# Patient Record
Sex: Female | Born: 1950 | Race: White | Hispanic: No | Marital: Married | Smoking: Never smoker
Health system: Southern US, Community
[De-identification: ages and names within clinical notes are randomized; demographics above are authoritative.]

## PROBLEM LIST (undated history)

## (undated) DIAGNOSIS — A6000 Herpesviral infection of urogenital system, unspecified: Secondary | ICD-10-CM

## (undated) DIAGNOSIS — Z1501 Genetic susceptibility to malignant neoplasm of breast: Secondary | ICD-10-CM

## (undated) DIAGNOSIS — Z1502 Genetic susceptibility to malignant neoplasm of ovary: Secondary | ICD-10-CM

## (undated) DIAGNOSIS — K5790 Diverticulosis of intestine, part unspecified, without perforation or abscess without bleeding: Secondary | ICD-10-CM

## (undated) DIAGNOSIS — Z1509 Genetic susceptibility to other malignant neoplasm: Secondary | ICD-10-CM

## (undated) DIAGNOSIS — C569 Malignant neoplasm of unspecified ovary: Secondary | ICD-10-CM

## (undated) DIAGNOSIS — H9319 Tinnitus, unspecified ear: Secondary | ICD-10-CM

## (undated) DIAGNOSIS — C439 Malignant melanoma of skin, unspecified: Secondary | ICD-10-CM

## (undated) DIAGNOSIS — E041 Nontoxic single thyroid nodule: Secondary | ICD-10-CM

## (undated) DIAGNOSIS — M1990 Unspecified osteoarthritis, unspecified site: Secondary | ICD-10-CM

## (undated) HISTORY — DX: Genetic susceptibility to malignant neoplasm of ovary: Z15.02

## (undated) HISTORY — DX: Malignant neoplasm of unspecified ovary: C56.9

## (undated) HISTORY — DX: Diverticulosis of intestine, part unspecified, without perforation or abscess without bleeding: K57.90

## (undated) HISTORY — DX: Genetic susceptibility to other malignant neoplasm: Z15.09

## (undated) HISTORY — DX: Nontoxic single thyroid nodule: E04.1

## (undated) HISTORY — DX: Tinnitus, unspecified ear: H93.19

## (undated) HISTORY — DX: Genetic susceptibility to malignant neoplasm of breast: Z15.01

## (undated) HISTORY — DX: Malignant melanoma of skin, unspecified: C43.9

## (undated) HISTORY — DX: Unspecified osteoarthritis, unspecified site: M19.90

## (undated) HISTORY — DX: Herpesviral infection of urogenital system, unspecified: A60.00

---

## 1972-08-13 HISTORY — PX: AXILLARY LYMPH NODE DISSECTION: SHX5229

## 1984-08-13 HISTORY — PX: BUNIONECTOMY: SHX129

## 2013-08-20 HISTORY — PX: PORTA CATH INSERTION: CATH118285

## 2013-08-20 HISTORY — PX: TOTAL ABDOMINAL HYSTERECTOMY W/ BILATERAL SALPINGOOPHORECTOMY: SHX83

## 2017-05-13 HISTORY — PX: PORTA CATH INSERTION: CATH118285

## 2017-10-10 ENCOUNTER — Other Ambulatory Visit: Payer: Self-pay | Admitting: Gynecologic Oncology

## 2017-10-10 DIAGNOSIS — Z1239 Encounter for other screening for malignant neoplasm of breast: Secondary | ICD-10-CM

## 2017-10-10 DIAGNOSIS — R978 Other abnormal tumor markers: Secondary | ICD-10-CM

## 2017-10-10 DIAGNOSIS — Z1501 Genetic susceptibility to malignant neoplasm of breast: Secondary | ICD-10-CM

## 2017-10-10 DIAGNOSIS — Z95828 Presence of other vascular implants and grafts: Secondary | ICD-10-CM

## 2017-10-10 DIAGNOSIS — Z1509 Genetic susceptibility to other malignant neoplasm: Secondary | ICD-10-CM

## 2017-10-10 DIAGNOSIS — Z1502 Genetic susceptibility to malignant neoplasm of ovary: Secondary | ICD-10-CM

## 2017-10-10 DIAGNOSIS — Z79899 Other long term (current) drug therapy: Secondary | ICD-10-CM

## 2017-10-10 MED ORDER — HEPARIN SOD (PORK) LOCK FLUSH 100 UNIT/ML IV SOLN
500.0000 [IU] | Freq: Once | INTRAVENOUS | Status: DC
  Filled 2017-10-10: qty 5

## 2017-10-10 MED ORDER — SODIUM CHLORIDE 0.9% FLUSH
10.0000 mL | INTRAVENOUS | Status: DC | PRN
  Filled 2017-10-10: qty 10

## 2017-10-22 ENCOUNTER — Encounter: Payer: Self-pay | Admitting: Gynecologic Oncology

## 2017-10-23 ENCOUNTER — Inpatient Hospital Stay: Payer: Medicare Other | Attending: Gynecologic Oncology | Admitting: Obstetrics

## 2017-10-23 ENCOUNTER — Inpatient Hospital Stay: Payer: Medicare Other

## 2017-10-23 ENCOUNTER — Encounter: Payer: Self-pay | Admitting: Obstetrics

## 2017-10-23 ENCOUNTER — Encounter: Payer: Self-pay | Admitting: Gynecologic Oncology

## 2017-10-23 VITALS — BP 136/78 | HR 88 | Temp 97.9°F | Resp 20 | Ht 64.0 in | Wt 117.3 lb

## 2017-10-23 DIAGNOSIS — Z79899 Other long term (current) drug therapy: Secondary | ICD-10-CM

## 2017-10-23 DIAGNOSIS — Z800 Family history of malignant neoplasm of digestive organs: Secondary | ICD-10-CM

## 2017-10-23 DIAGNOSIS — D649 Anemia, unspecified: Secondary | ICD-10-CM

## 2017-10-23 DIAGNOSIS — C569 Malignant neoplasm of unspecified ovary: Secondary | ICD-10-CM | POA: Diagnosis present

## 2017-10-23 DIAGNOSIS — Z90710 Acquired absence of both cervix and uterus: Secondary | ICD-10-CM

## 2017-10-23 DIAGNOSIS — R978 Other abnormal tumor markers: Secondary | ICD-10-CM

## 2017-10-23 DIAGNOSIS — D6489 Other specified anemias: Secondary | ICD-10-CM

## 2017-10-23 DIAGNOSIS — Z1509 Genetic susceptibility to other malignant neoplasm: Secondary | ICD-10-CM

## 2017-10-23 DIAGNOSIS — M542 Cervicalgia: Secondary | ICD-10-CM

## 2017-10-23 DIAGNOSIS — Z95828 Presence of other vascular implants and grafts: Secondary | ICD-10-CM

## 2017-10-23 DIAGNOSIS — D569 Thalassemia, unspecified: Secondary | ICD-10-CM | POA: Diagnosis not present

## 2017-10-23 DIAGNOSIS — Z1501 Genetic susceptibility to malignant neoplasm of breast: Secondary | ICD-10-CM

## 2017-10-23 DIAGNOSIS — Z1502 Genetic susceptibility to malignant neoplasm of ovary: Secondary | ICD-10-CM

## 2017-10-23 DIAGNOSIS — Z9221 Personal history of antineoplastic chemotherapy: Secondary | ICD-10-CM | POA: Diagnosis not present

## 2017-10-23 DIAGNOSIS — Z90722 Acquired absence of ovaries, bilateral: Secondary | ICD-10-CM | POA: Diagnosis not present

## 2017-10-23 DIAGNOSIS — D696 Thrombocytopenia, unspecified: Secondary | ICD-10-CM | POA: Diagnosis not present

## 2017-10-23 DIAGNOSIS — C801 Malignant (primary) neoplasm, unspecified: Secondary | ICD-10-CM

## 2017-10-23 LAB — CBC WITH DIFFERENTIAL (CANCER CENTER ONLY)
BASOS ABS: 0 10*3/uL (ref 0.0–0.1)
BASOS PCT: 0 %
Eosinophils Absolute: 0 10*3/uL (ref 0.0–0.5)
Eosinophils Relative: 1 %
HEMATOCRIT: 29.8 % — AB (ref 34.8–46.6)
Hemoglobin: 9.9 g/dL — ABNORMAL LOW (ref 11.6–15.9)
Lymphocytes Relative: 25 %
Lymphs Abs: 1.3 10*3/uL (ref 0.9–3.3)
MCH: 31.3 pg (ref 25.1–34.0)
MCHC: 33.2 g/dL (ref 31.5–36.0)
MCV: 94.2 fL (ref 79.5–101.0)
MONO ABS: 0.5 10*3/uL (ref 0.1–0.9)
Monocytes Relative: 9 %
NEUTROS ABS: 3.6 10*3/uL (ref 1.5–6.5)
NEUTROS PCT: 65 %
Platelet Count: 213 10*3/uL (ref 145–400)
RBC: 3.17 MIL/uL — AB (ref 3.70–5.45)
RDW: 15.3 % — AB (ref 11.2–14.5)
WBC: 5.5 10*3/uL (ref 3.9–10.3)

## 2017-10-23 LAB — HEPATIC FUNCTION PANEL
ALT: 24 U/L (ref 14–54)
AST: 36 U/L (ref 15–41)
Albumin: 4.3 g/dL (ref 3.5–5.0)
Alkaline Phosphatase: 90 U/L (ref 38–126)
BILIRUBIN INDIRECT: 0.7 mg/dL (ref 0.3–0.9)
Bilirubin, Direct: 0.1 mg/dL (ref 0.1–0.5)
TOTAL PROTEIN: 6.9 g/dL (ref 6.5–8.1)
Total Bilirubin: 0.8 mg/dL (ref 0.3–1.2)

## 2017-10-23 LAB — BASIC METABOLIC PANEL
ANION GAP: 9 (ref 3–11)
BUN: 17 mg/dL (ref 7–26)
CO2: 25 mmol/L (ref 22–29)
Calcium: 9.2 mg/dL (ref 8.4–10.4)
Chloride: 106 mmol/L (ref 98–109)
Creatinine, Ser: 0.88 mg/dL (ref 0.60–1.10)
Glucose, Bld: 86 mg/dL (ref 70–140)
POTASSIUM: 3.6 mmol/L (ref 3.5–5.1)
SODIUM: 140 mmol/L (ref 136–145)

## 2017-10-23 MED ORDER — NIRAPARIB TOSYLATE 100 MG PO CAPS: 200.0000 mg | ORAL_CAPSULE | Freq: Every day | ORAL | 4 refills | Status: DC

## 2017-10-23 MED ORDER — SODIUM CHLORIDE 0.9% FLUSH
10.0000 mL | Freq: Once | INTRAVENOUS | Status: AC
  Administered 2017-10-23: 10 mL
  Filled 2017-10-23: qty 10

## 2017-10-23 MED ORDER — SODIUM CHLORIDE 0.9% FLUSH
10.0000 mL | INTRAVENOUS | Status: DC | PRN
  Filled 2017-10-23: qty 10

## 2017-10-23 MED ORDER — HEPARIN SOD (PORK) LOCK FLUSH 100 UNIT/ML IV SOLN
500.0000 [IU] | Freq: Once | INTRAVENOUS | Status: AC
  Administered 2017-10-23: 500 [IU]
  Filled 2017-10-23: qty 5

## 2017-10-23 NOTE — Progress Notes (Signed)
Consult Note: Gyn-Onc  CC:  Chief Complaint  Patient presents with  . Malignant neoplasm of ovary, unspecified laterality [facility])    HPI: Madeline Perry  is a very nice 67 y.o.  year old P0  Interval History: Recall she was treated for ovarian cancer originally by Dr. Gifford Shave at my [facility]ma [facility] in [place].  She returns today to follow-up on PARP maintenance.  Recall she did have a bit of a delay in her initiation due to insurance issues.  Issues:  . Neuropathy was notable with her second line platinum Taxol therapy.  No complaints by the end of chemotherapy treatment . Thyroid nodule-PCP managing.  Biopsy has been done and reportedly benign . Vaginal spotting April 11, 2017-described as pink and watery staining underwear to times not enough to leak through.  Denies increased activity surrounding this episode.  Felt to be atrophy based on my exam at that time. . Thrombocytopenia-third week after neuropathy of started at standard dosing of 300 she did experience thrombocytopenia with platelets down to 20 3K.  We held her treatment May 22, 2017.  Repeat platelet May 28, 2017 was 9K and she was transfused.  She was then dose reduced down to 200 and experienced anemia which required transfusion in late 2018 . Anemia-cycle 3 of niraparib observed to 8.9.  Iron studies were ordered and normal.    08/27/2017 she did have anemia significant enough to require transfusion she was held and restarted and presents today in follow-up at my new practice site.    Measurement of disease: CA 125 and imaging   Oncology History   She presented in December 2014 to her PCP with pelvic pain and imaging was performed.  A pelvic mass was found and she made her way to Dr. Isaiah Blakes.  She then underwent IV/IP Taxol/cis/Taxol for 6 cycles.  Her Ca125 preop was >4000.  It looks to have fallen nicely during treatment and was 11.9 in November 2016 1 year after her treatment ended.  She was  found to have a BRCA2 mutation, documented now in the chart, from myriad my risk testing March 2015.  Recurrence February 2018.  Received 6 cycles of IV carboplatin Taxol. Issues with second line chemotherapy included neuropathy and petechiae, now resolved, in lower extremities, but no thrombocytopenia.  PARP maintenance 05/02/2017. Experienced thrombocytopenia and anemia.      Malignant neoplasm of ovary ([place])   10/2013 Genetic Testing    Patient has genetic testing done for her ovarian cancer diagnosis. 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 07/31/2013-UTS W-peritoneal carcinomatosis omental caking large mass in the pelvis liver and spleen do not have any focal or diffuse parenchymal abnormalities.  Calcification in the right lung base probable granuloma.  Ascites. . 11/08/2016-RT a-a heterogeneous nodule from the posterior right thyroid measuring 1.7 x 1.3.  Multiple metastatic lesions in the liver including a segment 3 lesion 3.3 x 2.9 cm and a segment 7 lesion measuring 3.2 x 3 cm.  In the pelvis there is a soft tissue mass more superiorly measuring 2.4 x 1.6 cm and then inferior to this measuring 2.8 x 2.3 cm.  Addendum compared to prior there are no significant differences in the overall interpretation.  Note that the thyroid lesion was not present on the  prior. . 12/21/2016-thyroid ultrasound right-sided thyroid nodules recommend 3-54-monthfollow-up. . 02/11/2017-CT CAP-[facility]-hepatic metastases have decreased in size or resolved compared to March 2018 index lesions remaining today include 14 mm right hepatic lobe; 11 x 11 mm right hepatic lobe abutting the caudate lobe; 7 mm anterior right hepatic lobe; 16 mm lateral segment left hepatic lobe.  Left pelvic mass no longer seen. .  02/11/2017-PET-[facility]-liver demonstrates areas of diminished FDG avidity consistent with cystlike changes no distinct FDG avid abnormalities noted; longitudinally oriented increased activity along the lateral aspect of the sigmoid colon at the mid pelvis SUV 3.11. . 08/26/2017-RTA-CT [facility]-large calcified granuloma right middle lobe.  Focal area of masslike consolidation right middle lobe which is new from prior study 2.3 cm pneumonia versus malignancy.  Degenerative changes in the spine with convex scoliosis at the thoracic lumbar junction.  Diffuse fatty infiltration of the liver has decreased.  Low-attenuation mass posterior superior segment right lobe of the liver measures 0.9 cm versus 1.8 cm February 11, 2017.  Subtle low-attenuation lesion in the posterior superior segment of the right lobe of the liver measuring 0.6 cm on current exam versus 1.1 cm 02/11/2017.  Low-attenuation lesion in the left lobe of the liver which measures 0.7 cm versus 1.6 cm July 2018.  No new or progressive liver lesions seen.  Diverticulosis.  Mild circumferential wall thickening involving the sigmoid colon left pelvis which does correlate with an area of uptake on a PET July 2018.  No peritoneal based soft tissue mass demonstrated.  No adnexal mass.        Tumor Marker    Ca125 08/18/2013 preop > 4000 06/2015 = 11.9 03/2016 = 16___________________ Recurrence (2.5 years disease free) 09/2016 = 45 recheck 63 11/08/2016 = 292 12/21/2016 = 91 (cycle 2) 01/09/2017 = 17.1 (cycle 3) 03/14/2017 = 14 (cycle 6)   end of treatment_____ Niraparib start 05/02/2017 05/08/2017 = 11.2 (C1) 05/28/2017 = 9 (C2) 06/27/2017 = 13 (C3) 07/2017 = ______(C4) 08/14/2017 = 10 (C5) CHANGE Facility of care 10/2017 = ______________ 07/22/2017 = 08/14/17 = 10 No results for input(s): CA125, CEA, CA199, ESTRADIOL in the last 8760 hours.  Invalid input(s): INHIBINA  No results found for: LABCA2   No results found for: CVZS827         Current Meds:   Outpatient Encounter Medications as of 10/23/2017  Medication Sig  . Ascorbic Acid (VITAMIN C PO) Take by mouth.  .Marland KitchenGLUTAMINE PO Take by mouth daily.   .Alanda SlimTosylate 100 MG CAPS Take 200 mg by mouth daily. With or without food at the same time daily. May take at bedtime to manage nausea  . Pyridoxine HCl (VITAMIN B-6 PO) Take by mouth.  . vitamin B-12 (CYANOCOBALAMIN) 1000 MCG tablet Take 1,000 mcg by mouth daily.  . [DISCONTINUED] Niraparib Tosylate 100 MG CAPS Take 200 mg by mouth daily. With or without food at the same time daily. May take at bedtime to manage nausea  . [DISCONTINUED] ondansetron (ZOFRAN) 4 MG tablet Take 4 mg by mouth every 12 (twelve) hours.  . [DISCONTINUED] promethazine (PHENERGAN) 25 MG tablet Take 25 mg by mouth every 6 (six) hours as needed.   Facility-Administered Encounter Medications as of 10/23/2017  Medication  . sodium chloride flush (NS) 0.9 % injection 10 mL    Allergy: No Known Allergies  Social Hx:   Social History   Socioeconomic History  . Marital status: Married    Spouse name: Not on file  . Number  of children: Not on file  . Years of education: Not on file  . Highest education level: Not on file  Social Needs  . Financial resource strain: Not on file  . Food insecurity - worry: Not on file  . Food insecurity - inability: Not on file  . Transportation needs - medical: Not on file  . Transportation needs - non-medical: Not on file  Occupational History  . Not on file  Tobacco Use  . Smoking status: Never Smoker  . Smokeless tobacco: Never Used  Substance and Sexual Activity  . Alcohol use: Yes    Comment: occasional  . Drug use: No  . Sexual activity: Yes  Other Topics Concern  . Not on file  Social History Narrative  . Not on file    Past Surgical Hx:  Past Surgical History:  Procedure Laterality Date  . AXILLARY LYMPH NODE DISSECTION  1974   right for melanoma followed by chemo  . BUNIONECTOMY Left 08/1984  .  PORTA CATH INSERTION  08/20/2013   INTRAPERITONEAL at the time of TAH, BSO; removed 2015  . Glori Luis CATH INSERTION  05/2017   [facility]  . TOTAL ABDOMINAL HYSTERECTOMY W/ BILATERAL SALPINGOOPHORECTOMY  08/20/2013   TAH, BSO, tumor debulking, bowel resection with reanastomosis, splenectomy, omentectomy, argon beam coag, diaphragm stripping    Past Medical Hx:  Past Medical History:  Diagnosis Date  . BRCA2 gene mutation positive in female   . Diverticulosis   . Genital herpes   . History of chemotherapy    6 cycles of carb/taxol  . Melanoma ([place])   . Osteoarthritis   . Ovarian cancer ([place])   . Thyroid nodule   . Tinnitus     Past Gynecological History:   GYNECOLOGIC HISTORY:  No LMP recorded. Menarche: 67 years old P 0   Family Hx:  Family History  Problem Relation Age of Onset  . Ovarian cancer Mother   . Breast cancer Maternal Grandmother   . Uterine cancer Maternal Aunt     Review of Systems:  Review of Systems  Constitutional: Negative.   HENT: Negative.   Eyes: Negative.   Respiratory: Positive for shortness of breath.   Cardiovascular: Negative.   Gastrointestinal: Negative.   Genitourinary: Negative.   Musculoskeletal: Positive for neck pain.  Skin: Negative.   Neurological: Negative.   Endo/Heme/Allergies: Negative.   Psychiatric/Behavioral: Negative.    SOB with exertion Neck pain is described when she extends her neck backwards there is a pain radiating down her left scapular/shoulder area Complains of some left thumb swelling in the morning    Vitals:  Blood pressure 136/78, pulse 88, temperature 97.9 F (36.6 C), temperature source Oral, resp. rate 20, height '5\' 4"'  (1.626 m), weight 117 lb 4.8 oz (53.2 kg), SpO2 100 %.  Physical Exam: ECOG PERFORMANCE STATUS: 0 - Asymptomatic   General :  Well developed, 67 y.o., female in no apparent distress HEENT:  Normocephalic/atraumatic, symmetric, EOMI, eyelids normal Neck:    Supple, no masses.  Lymphatics:  No cervical/ submandibular/ supraclavicular/ infraclavicular/ inguinal adenopathy Respiratory:  Respirations unlabored, no use of accessory muscles CV:   Deferred Breast:  Deferred Musculoskeletal: No CVA tenderness, normal muscle strength. Abdomen: Soft, non-tender and nondistended. No evidence of hernia. No masses. Extremities:  No lymphedema, no erythema, non-tender. Skin:   Normal inspection Neuro/Psych:  No focal motor deficit, no abnormal mental status. Normal gait. Normal affect. Alert and oriented to person, place, and time  Genito Urinary:  Vulva:  Normal external female genitalia.   Bladder/urethra:  Urethral meatus normal in size and location. No lesions or masses, well supported bladder  Vagina: No lesion, no discharge, no bleeding.  Cervix/Uterus/Adnexa: Surgically absent  Bimanual: No masses no lesions  Rectovaginal:  Good tone, no masses, no cul de sac nodularity, no parametrial involvement or nodularity.   Oncologic Summary: 1. Stage 3C (vs 4) Ovarian Cancer  - s/p Debulking Jan 2015 at [facility] (Dr. Gifford Shave) ELAP/TAH/BSO/Splenectomy/ TColon rsxn with RA 08/20/13   - splenic pathology with "superficial parenchymal disease"  - s/p 6 cycles IV/IP Taxol/Cis/Taxol 01/06/18 2. Recurrent disease Feb 2018 by rising CA125 and imaging  - Carbo/Taxol Q3 week 11/30/17-03/14/17 (partial response)  - Niraparib 05/02/17 - present   Start 338m with thrombocytopenia requiring dose reduction   Decrease to 2067m10/2018 with anemia 3. 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 BRCA2 predispositions  . Breast screening   - [place] [place] [facility] for breast MRI    - NOTE she has vacation plans in mid-Sept so we'll have to work around that with scheduling. Probably end of Sept.   - [place]  . Pancreatic risks   - we discussed referral to GI at some point to discuss options. For now we agreed on annual CA19-9 which is due today and each March going forward now. 2. Port was placed by IR.  . I gave [facility] today [facility] here, so in 6 weeks and another on her return here in 3 months 3. Recurrent ovarian cancer  . tumor profiling has been discussed on previous visits; we'll see if she progresses on PARP before revisiting  . CA 125 is her MOD and will be checked monthly  . PARP maintenance continues, which was started with measurable disease on imaging   - [facility] and she appeared to be NED.     - there was some discussion of ? PNA versus malignant process in the lungs. Followup CXR was done from my recollection.     - The office here is going to request that to be sure. She does have some SOB with exertion that seems stable from prior visits. 4. Thrombocytopenia  . likely due to PARP  . Had count down to 9000 and transfused 05/2017 with hold and dose reduction on PARP 5. Anemia  . Had transfusion 2UPRBC 08/27/17 after being on 20078mose of niraparib 3 months.  . Noted to show slow drift since that time. 2/15 = 11; 2/20 = 11.3; 3/1 = 10.4.  . rMarland Kitchenpeat today and continue Q2 weekly check 6. PARP management  . monthly BMP, LFT's, CA125, CBC (however see note above re: anemia)  . refill planned today; Melissa will contact [facility] ([phone] see how to proceed  7. Neck pain radiating down left shoulder  . Check Cspine plain films - 4 view 8.  She asked me today about donation/research options once she passes. [facility] and [facility] know.  RTC Q 3 months for pelvic and monitoring of labs.    ShaIsabel CapriceD  10/23/2017, 4:58 PM   '@APPTREFPROVNAME' @ '@[email or web address]' @

## 2017-10-23 NOTE — Patient Instructions (Signed)
Plan . Labs have been ordered for you to have a CBC every 2 weeks to check for anemia . Monthly we will check your electrolytes and liver function along with your Ca1 25 . A C-spine x-ray will be ordered due to your neck pain . We will contact you about your tumor markers drawn today along with your mammogram results. . Your PARP inhibitor will be refilled . [place] to clinic in [place] in 3 months for repeat lab work/and pelvic

## 2017-10-23 NOTE — Progress Notes (Signed)
Script for Niraparib faxed to [facility] at [phone].

## 2017-10-24 ENCOUNTER — Telehealth: Payer: Self-pay | Admitting: Gynecologic Oncology

## 2017-10-24 LAB — CA 125: Cancer Antigen (CA) 125: 12.4 U/mL (ref 0.0–38.1)

## 2017-10-24 LAB — CANCER ANTIGEN 19-9: CA 19-9: 26 U/mL (ref 0–35)

## 2017-10-24 NOTE — Telephone Encounter (Signed)
Left message for patient with CA 125 and CA 19-9 results.  Advised her to please call for any questions or concerns.

## 2017-10-24 NOTE — Telephone Encounter (Signed)
Patient returned call to the office.  Informed of CA 125 and CA 19-9 results.  No concerns voiced.  Advised to call for any needs.

## 2017-10-31 ENCOUNTER — Telehealth: Payer: Self-pay | Admitting: *Deleted

## 2017-10-31 ENCOUNTER — Telehealth: Payer: Self-pay

## 2017-10-31 NOTE — Telephone Encounter (Signed)
Pt. Calling from the dental office.  She is there for a cleaning and repair of crown that popped off yesterday. Pt on Niraparib Tosylate 200 mg daily for her ovarian cancer. Told Becky the dental technician that her cbc/diff results from 10-23-17 were good to proceed with cleaning. WBC =5.5 ANC=3.6  Plts= 213. Faxed a copy of cbc/ diff to dental office at [phone]

## 2017-10-31 NOTE — Telephone Encounter (Signed)
Faxed orders to radiology, lab, and OR to [facility]

## 2017-11-04 ENCOUNTER — Encounter: Payer: Self-pay | Admitting: Obstetrics

## 2017-11-07 ENCOUNTER — Telehealth: Payer: Self-pay | Admitting: Gynecologic Oncology

## 2017-11-07 NOTE — Telephone Encounter (Signed)
Left message for patient with C spine xray results along with Dr. Gerarda Fraction recommendations for referral to spine or neurosurgeon for further evaluation.  Advised patient that we could place the referral or fax the results to her PCP to have the referral initiated through their office.  Advised her to please call the office to discuss.

## 2017-11-07 NOTE — Telephone Encounter (Signed)
Patient returned call to the office.  Stating she would prefer to have the results faxed to her primary care provider for her to evaluate and make the referral.  Patient asking about her lab work as well but advised we had not received it yet but would call to inquire about the results.  Advised she would be contacted with the results.  No concerns voiced.  Advised to call for any needs or concerns.

## 2017-11-11 ENCOUNTER — Encounter: Payer: Self-pay | Admitting: Gynecologic Oncology

## 2017-11-11 ENCOUNTER — Telehealth: Payer: Self-pay

## 2017-11-11 NOTE — Progress Notes (Signed)
C spine xray and lab results faxed to patient's Dr. Harlon Ditty at [phone]

## 2017-11-11 NOTE — Telephone Encounter (Signed)
Told Ms Waldren that her cbc/diff from 11-08-17 looked good.  She can continue with her niraparib tosylate 200 mg daily and repeat cbc/diff in 2 weeks.

## 2017-11-20 ENCOUNTER — Telehealth: Payer: Self-pay | Admitting: Gynecologic Oncology

## 2017-11-20 NOTE — Telephone Encounter (Signed)
Patient notified of lab results including hgb of 7.1.  Per Dr. Gerarda Fraction, patient advised to hold on taking the PARP at this time and to plan for a repeat CBC in one week.  Per the patient, she is out of town in [place] from the 14-19th of April and asking if she can have it checked in two weeks when she goes for her port flush.  Per Dr. Gerarda Fraction, patient can have checked in two weeks but needs to stay vigilant for anemic symptoms.  Returned call to patient and left message about having it rechecked in two weeks and to remain aware of her symptoms.  She currently states she is asymptomatic except for mild shortness of breath with strenuous activity that resolves immediately.  Advised her to please call the office for any questions or concerns.

## 2017-11-21 ENCOUNTER — Telehealth: Payer: Self-pay

## 2017-11-21 NOTE — Telephone Encounter (Signed)
LM for patient that her CA-125 from 11-20-17 was good at 6.7 per Joylene John, NP.  Call the office if she has any questions or concerns.

## 2017-11-28 ENCOUNTER — Telehealth: Payer: Self-pay | Admitting: *Deleted

## 2017-11-28 NOTE — Telephone Encounter (Signed)
Faxed lab orders to [facility]

## 2017-12-04 ENCOUNTER — Telehealth: Payer: Self-pay | Admitting: Gynecologic Oncology

## 2017-12-04 ENCOUNTER — Telehealth: Payer: Self-pay | Admitting: *Deleted

## 2017-12-04 DIAGNOSIS — D6489 Other specified anemias: Secondary | ICD-10-CM

## 2017-12-04 NOTE — Telephone Encounter (Signed)
Fax order to [facility] for CBC lab

## 2017-12-04 NOTE — Telephone Encounter (Signed)
Patient states she is doing well.  States she had her labs drawn yesterday.  Advised we have not received the results but will be calling the facility to obtain them and then call her back to discuss.  No concerns voiced.

## 2017-12-04 NOTE — Telephone Encounter (Signed)
Left message for patient advising her of her lab work from yesterday and advised her to continue to hold her PARP inhibitor and plan for a repeat CBC in one week.  Advised that once her hemoglobin reaches 9, we would resume the PARP inhibitor at an adjusted dose.  Advised to call the office for any questions.

## 2017-12-10 ENCOUNTER — Telehealth: Payer: Self-pay

## 2017-12-10 NOTE — Telephone Encounter (Signed)
LM for Madeline Perry stating that the order for a cbc was faxed to [facility] on 12-04-17.  She can go today any time this afternoon to have labs drawn.

## 2017-12-11 ENCOUNTER — Telehealth: Payer: Self-pay | Admitting: Gynecologic Oncology

## 2017-12-11 NOTE — Telephone Encounter (Signed)
[facility] called to see if the patient's PARP was on hold or discontinued.  Advised that the medication was on hold at this time pending lab redraw today due to anemia.

## 2017-12-12 ENCOUNTER — Telehealth: Payer: Self-pay | Admitting: *Deleted

## 2017-12-12 ENCOUNTER — Telehealth: Payer: Self-pay | Admitting: Gynecologic Oncology

## 2017-12-12 DIAGNOSIS — C569 Malignant neoplasm of unspecified ovary: Secondary | ICD-10-CM

## 2017-12-12 NOTE — Telephone Encounter (Signed)
Left message with patient to let her know that we have not received her lab results just yet, our office will call and have lab results faxed over and give her a call back.

## 2017-12-13 ENCOUNTER — Other Ambulatory Visit: Payer: Self-pay | Admitting: Gynecologic Oncology

## 2017-12-13 ENCOUNTER — Telehealth: Payer: Self-pay | Admitting: *Deleted

## 2017-12-13 DIAGNOSIS — Z79899 Other long term (current) drug therapy: Secondary | ICD-10-CM

## 2017-12-13 DIAGNOSIS — C569 Malignant neoplasm of unspecified ovary: Secondary | ICD-10-CM

## 2017-12-13 MED ORDER — NIRAPARIB TOSYLATE 100 MG PO CAPS: 100.0000 mg | ORAL_CAPSULE | ORAL | 0 refills | Status: DC

## 2017-12-13 NOTE — Telephone Encounter (Signed)
Faxed lab order to [facility]

## 2017-12-13 NOTE — Telephone Encounter (Signed)
Spoke with patient and informed her of CBC results.  Advised her of the recommendations per Dr. Gerarda Fraction to begin taking niraparib on Monday May 6, she is to begin taking 200 mg then alternate with 100 mg the next day then back to 200 mg.  She will continue with this pattern for one month then plan to follow up with Dr. Gerarda Fraction in the office.  It is also recommended that she have a CBC weekly for the next month along with a Cmet and CA 125 monthly.  Appt made for June 3.  She states she will be due for a flush that day so she was going to plan on having it here in [place] along with her lab work.  Advised to call for any needs.  New prescription will be sent to [facility].  No concerns voiced at the end of the call.

## 2017-12-25 ENCOUNTER — Telehealth: Payer: Self-pay

## 2017-12-25 NOTE — Telephone Encounter (Signed)
Told Madeline Perry that her Hgb was up to 11.6.  ANC was good at 6.6. Platelets  were good at 311K. Dr. Gerarda Fraction stated to continue the niraparib at current dosing of 200 mg alternating  with 100 mg. Recheck CBC/diff weely as scheduled.  Pt verbalized understanding.

## 2017-12-30 ENCOUNTER — Telehealth: Payer: Self-pay

## 2017-12-30 NOTE — Telephone Encounter (Signed)
LM stating that her blood counts looked good.  Her Hgb is 12.0 today.  She is to continue with 200 mg alt with 100 mg. Recheck CBC/diff in one week as scheduled. Call office if any questions or concerns.

## 2018-01-13 ENCOUNTER — Inpatient Hospital Stay: Payer: Medicare Other

## 2018-01-13 ENCOUNTER — Encounter: Payer: Self-pay | Admitting: Obstetrics

## 2018-01-13 ENCOUNTER — Inpatient Hospital Stay: Payer: Medicare Other | Attending: Gynecologic Oncology | Admitting: Obstetrics

## 2018-01-13 ENCOUNTER — Encounter: Payer: Self-pay | Admitting: Oncology

## 2018-01-13 VITALS — BP 143/70 | HR 78 | Temp 98.2°F | Resp 20 | Ht 64.0 in | Wt 115.9 lb

## 2018-01-13 DIAGNOSIS — C5700 Malignant neoplasm of unspecified fallopian tube: Secondary | ICD-10-CM

## 2018-01-13 DIAGNOSIS — Z1501 Genetic susceptibility to malignant neoplasm of breast: Secondary | ICD-10-CM

## 2018-01-13 DIAGNOSIS — C569 Malignant neoplasm of unspecified ovary: Secondary | ICD-10-CM | POA: Insufficient documentation

## 2018-01-13 DIAGNOSIS — Z9221 Personal history of antineoplastic chemotherapy: Secondary | ICD-10-CM | POA: Insufficient documentation

## 2018-01-13 DIAGNOSIS — Z803 Family history of malignant neoplasm of breast: Secondary | ICD-10-CM | POA: Insufficient documentation

## 2018-01-13 DIAGNOSIS — Z90710 Acquired absence of both cervix and uterus: Secondary | ICD-10-CM

## 2018-01-13 DIAGNOSIS — Z79899 Other long term (current) drug therapy: Secondary | ICD-10-CM

## 2018-01-13 DIAGNOSIS — A6000 Herpesviral infection of urogenital system, unspecified: Secondary | ICD-10-CM | POA: Insufficient documentation

## 2018-01-13 DIAGNOSIS — Z95828 Presence of other vascular implants and grafts: Secondary | ICD-10-CM

## 2018-01-13 DIAGNOSIS — Z1509 Genetic susceptibility to other malignant neoplasm: Secondary | ICD-10-CM | POA: Diagnosis not present

## 2018-01-13 DIAGNOSIS — Z90722 Acquired absence of ovaries, bilateral: Secondary | ICD-10-CM | POA: Diagnosis not present

## 2018-01-13 DIAGNOSIS — C801 Malignant (primary) neoplasm, unspecified: Principal | ICD-10-CM

## 2018-01-13 LAB — COMPREHENSIVE METABOLIC PANEL
ALBUMIN: 4.5 g/dL (ref 3.5–5.0)
ALK PHOS: 82 U/L (ref 40–150)
ALT: 22 U/L (ref 0–55)
AST: 34 U/L (ref 5–34)
Anion gap: 9 (ref 3–11)
BILIRUBIN TOTAL: 0.4 mg/dL (ref 0.2–1.2)
BUN: 17 mg/dL (ref 7–26)
CALCIUM: 9 mg/dL (ref 8.4–10.4)
CO2: 26 mmol/L (ref 22–29)
Chloride: 105 mmol/L (ref 98–109)
Creatinine, Ser: 0.94 mg/dL (ref 0.60–1.10)
GFR calc Af Amer: >60 mL/min (ref >60)
GFR calc non Af Amer: >60 mL/min (ref >60)
GLUCOSE: 92 mg/dL (ref 70–140)
Potassium: 3.7 mmol/L (ref 3.5–5.1)
Sodium: 140 mmol/L (ref 136–145)
TOTAL PROTEIN: 7.6 g/dL (ref 6.4–8.3)

## 2018-01-13 LAB — BILIRUBIN, FRACTIONATED(TOT/DIR/INDIR)
Bilirubin, Direct: <0.1 mg/dL — ABNORMAL LOW (ref 0.1–0.5)
Total Bilirubin: 0.7 mg/dL (ref 0.3–1.2)

## 2018-01-13 LAB — CBC WITH DIFFERENTIAL (CANCER CENTER ONLY)
Basophils Absolute: 0 10*3/uL (ref 0.0–0.1)
Basophils Relative: 1 %
EOS ABS: 0.1 10*3/uL (ref 0.0–0.5)
Eosinophils Relative: 2 %
HCT: 34.4 % — ABNORMAL LOW (ref 34.8–46.6)
HEMOGLOBIN: 11.3 g/dL — AB (ref 11.6–15.9)
LYMPHS ABS: 1.5 10*3/uL (ref 0.9–3.3)
Lymphocytes Relative: 29 %
MCH: 31.1 pg (ref 25.1–34.0)
MCHC: 32.8 g/dL (ref 31.5–36.0)
MCV: 94.8 fL (ref 79.5–101.0)
MONO ABS: 0.5 10*3/uL (ref 0.1–0.9)
MONOS PCT: 11 %
NEUTROS PCT: 57 %
Neutro Abs: 3 10*3/uL (ref 1.5–6.5)
Platelet Count: 224 10*3/uL (ref 145–400)
RBC: 3.63 MIL/uL — ABNORMAL LOW (ref 3.70–5.45)
RDW: 14.8 % — AB (ref 11.2–14.5)
WBC Count: 5.1 10*3/uL (ref 3.9–10.3)

## 2018-01-13 LAB — CBC
HCT: 34.5 % — ABNORMAL LOW (ref 34.8–46.6)
Hemoglobin: 11.4 g/dL — ABNORMAL LOW (ref 11.6–15.9)
MCH: 31.4 pg (ref 25.1–34.0)
MCHC: 33 g/dL (ref 31.5–36.0)
MCV: 95.4 fL (ref 79.5–101.0)
Platelets: 221 10*3/uL (ref 145–400)
RBC: 3.62 MIL/uL — ABNORMAL LOW (ref 3.70–5.45)
RDW: 15.3 % — AB (ref 11.2–14.5)
WBC: 4.9 10*3/uL (ref 3.9–10.3)

## 2018-01-13 MED ORDER — SODIUM CHLORIDE 0.9% FLUSH
10.0000 mL | Freq: Once | INTRAVENOUS | Status: AC
  Administered 2018-01-13: 10 mL
  Filled 2018-01-13: qty 10

## 2018-01-13 MED ORDER — HEPARIN SOD (PORK) LOCK FLUSH 100 UNIT/ML IV SOLN
500.0000 [IU] | Freq: Once | INTRAVENOUS | Status: AC
  Administered 2018-01-13: 500 [IU]
  Filled 2018-01-13: qty 5

## 2018-01-13 NOTE — Progress Notes (Signed)
Consult Note: Gyn-Onc  CC:  Chief Complaint  Patient presents with  . Malignant neoplasm of ovary, unspecified laterality [facility])    HPI: [name]  is a very nice 67 y.o.  year old P0  Interval History: Recall she was treated for ovarian cancer originally by Dr. Gifford Shave at [facility] [employer] in [place].  She returns today to follow-up on PARP maintenance.  Since her last visit we have her on the regimen which gives her essentially a 114m daily on average dosing. She started this 5/6 and labs since have been stable. So it has been just over a month now.  General Issues:  . Neuropathy was notable with her second line platinum Taxol therapy.  No complaints by the end of chemotherapy treatment . Thyroid nodule-PCP managing.  Biopsy has been done and reportedly benign . Vaginal spotting April 11, 2017-described as pink and watery staining underwear to times not enough to leak through.  Denies increased activity surrounding this episode.  Felt to be atrophy based on my exam at that time.  She tells me today she is having issues at home and having trouble sleeping. She wants to know if she can change the PARP to morning dosing. She asks me for sleep medication  She is worried her herpes may break out again, but does not want medication for that.   [facility] was normal here 10/23/17 at 26  Neck radiating pain - CSpine xray was normal 11/06/17; tolerating by trying to avoid movement that incites the pain  PARP Issues: . Thrombocytopenia   3rd week after standard dosing of 300 with platelets down to 20 K.  We held her treatment May 22, 2017.  Repeat platelet May 28, 2017 was 9K and she was transfused.  She was then dose reduced down to 200   anemia which required transfusion in late 2018 . Anemia  cycle 3 observed to 8.9.  Iron studies were ordered and normal.    08/27/2017 she did have anemia significant enough to require  transfusion she was held and restarted   [place] 100 QOD.  Tells me today her sister found to be BRCA and sounds like she has breast cancer metastatic to the nodes. Patient asks me about prophylactic mastectomy   Measurement of disease:  CA 125 and imaging Ca125 08/18/2013 preop > 4000 06/2015 = 11.9 03/2016 = 16___________________ Recurrence (2.5 years disease free) 09/2016 = 45 recheck 63 11/08/2016 = 292 12/21/2016 = 91 (cycle 2) 01/09/2017 = 17.1 (cycle 3) 03/14/2017 = 14 (cycle 6)   end of treatment_____ Niraparib start 05/02/2017 05/08/2017 = 11.2 (C1) 05/28/2017 = 9 (C2) 06/27/2017 = 13 (C3) 07/2017 = ______(C4) 08/14/2017 = 10 (C5) CHANGE Facility of care 10/2017 = 12.4 (C7) 11/2017 = 6.7 (C8) 12/2017 = missed due to misunderstanding with lab draws(C9) 01/2018 = drawn today (C10)   Oncology History   She presented in December 2014 to her PCP with pelvic pain and imaging was performed.  A pelvic mass was found and she made her way to Dr. CIsaiah Blakes  She then underwent IV/IP Taxol/cis/Taxol for 6 cycles.  Her Ca125 preop was >4000.  It looks to have fallen nicely during treatment and was 11.9 in November 2016 1 year after her treatment ended.  She was found to have a BRCA2 mutation, documented now in the chart, from myriad my risk testing  March 2015.  Recurrence February 2018.  Received 6 cycles of IV carboplatin Taxol. Issues with second line chemotherapy included neuropathy and petechiae, now resolved, in lower extremities, but no thrombocytopenia.  PARP maintenance 05/02/2017. Experienced thrombocytopenia and anemia.      Malignant neoplasm of ovary ([place])   10/2013 Genetic Testing    Patient has genetic testing done for her ovarian cancer diagnosis. Results revealed patient has the following mutation(s): Myriad my risk revealed [id number] (p.Met192Valfs*13):NM_[id number]; (aka delAT)       Miscellaneous    Measurement of  Disease : CA125 and imaging       Imaging    . 07/31/2013-UTS W-peritoneal carcinomatosis omental caking large mass in the pelvis liver and spleen do not have any focal or diffuse parenchymal abnormalities.  Calcification in the right lung base probable granuloma.  Ascites. . 11/08/2016-RT a-a heterogeneous nodule from the posterior right thyroid measuring 1.7 x 1.3.  Multiple metastatic lesions in the liver including a segment 3 lesion 3.3 x 2.9 cm and a segment 7 lesion measuring 3.2 x 3 cm.  In the pelvis there is a soft tissue mass more superiorly measuring 2.4 x 1.6 cm and then inferior to this measuring 2.8 x 2.3 cm.  Addendum compared to prior there are no significant differences in the overall interpretation.  Note that the thyroid lesion was not present on the prior. . 12/21/2016-thyroid ultrasound right-sided thyroid nodules recommend 3-68-monthfollow-up. . 02/11/2017-CT CAP[facility] imaging-hepatic metastases have decreased in size or resolved compared to March 2018 index lesions remaining today include 14 mm right hepatic lobe; 11 x 11 mm right hepatic lobe abutting the caudate lobe; 7 mm anterior right hepatic lobe; 16 mm lateral segment left hepatic lobe.  Left pelvic mass no longer seen. . 02/11/2017-PET-[facility]-liver demonstrates areas of diminished FDG avidity consistent with cystlike changes no distinct FDG avid abnormalities noted; longitudinally oriented increased activity along the lateral aspect of the sigmoid colon at the mid pelvis SUV 3.11. . 08/26/2017-RTA-CT [facility]-large calcified granuloma right middle lobe.  Focal area of masslike consolidation right middle lobe which is new from prior study 2.3 cm pneumonia versus malignancy.  Degenerative changes in the spine with convex scoliosis at the thoracic lumbar junction.  Diffuse fatty infiltration of the liver has decreased.  Low-attenuation mass posterior superior segment right lobe of the liver measures 0.9 cm versus 1.8 cm February 11, 2017.   Subtle low-attenuation lesion in the posterior superior segment of the right lobe of the liver measuring 0.6 cm on current exam versus 1.1 cm 02/11/2017.  Low-attenuation lesion in the left lobe of the liver which measures 0.7 cm versus 1.6 cm July 2018.  No new or progressive liver lesions seen.  Diverticulosis.  Mild circumferential wall thickening involving the sigmoid colon left pelvis which does correlate with an area of uptake on a PET July 2018.  No peritoneal based soft tissue mass demonstrated.  No adnexal mass.        Tumor Marker    Ca125 08/18/2013 preop > 4000         Current Meds:  Outpatient Encounter Medications as of 01/13/2018  Medication Sig  . Ascorbic Acid (VITAMIN C PO) Take by mouth.  .Marland KitchenGLUTAMINE PO Take by mouth daily.   .Alanda SlimTosylate 100 MG CAPS Take 100 mg by mouth as directed. Alternate between 100 mg daily then 200 mg on alternate days x 1 mth With or without food at the same time daily. May take at bedtime to manage  nausea  . Pyridoxine HCl (VITAMIN B-6 PO) Take by mouth.  . vitamin B-12 (CYANOCOBALAMIN) 1000 MCG tablet Take 1,000 mcg by mouth daily.  . [EXPIRED] heparin lock flush 100 unit/mL   . [EXPIRED] sodium chloride flush (NS) 0.9 % injection 10 mL    No facility-administered encounter medications on file as of 01/13/2018.     Allergy: No Known Allergies  Social Hx:   Social History   Socioeconomic History  . Marital status: Married    Spouse name: Not on file  . Number of children: Not on file  . Years of education: Not on file  . Highest education level: Not on file  Occupational History  . Not on file  Social Needs  . Financial resource strain: Not on file  . Food insecurity:    Worry: Not on file    Inability: Not on file  . Transportation needs:    Medical: Not on file    Non-medical: Not on file  Tobacco Use  . Smoking status: Never Smoker  . Smokeless tobacco: Never Used  Substance and Sexual Activity  . Alcohol use: Yes     Comment: occasional  . Drug use: No  . Sexual activity: Yes  Lifestyle  . Physical activity:    Days per week: Not on file    Minutes per session: Not on file  . Stress: Not on file  Relationships  . Social connections:    Talks on phone: Not on file    Gets together: Not on file    Attends religious service: Not on file    Active member of club or organization: Not on file    Attends meetings of clubs or organizations: Not on file    Relationship status: Not on file  . Intimate partner violence:    Fear of current or ex partner: Not on file    Emotionally abused: Not on file    Physically abused: Not on file    Forced sexual activity: Not on file  Other Topics Concern  . Not on file  Social History Narrative  . Not on file    Past Surgical Hx:  Past Surgical History:  Procedure Laterality Date  . AXILLARY LYMPH NODE DISSECTION  1974   right for melanoma followed by chemo  . BUNIONECTOMY Left 08/1984  . PORTA CATH INSERTION  08/20/2013   INTRAPERITONEAL at the time of TAH, BSO; removed 2015  . Glori Luis CATH INSERTION  05/2017   [facility]  . TOTAL ABDOMINAL HYSTERECTOMY W/ BILATERAL SALPINGOOPHORECTOMY  08/20/2013   TAH, BSO, tumor debulking, bowel resection with reanastomosis, splenectomy, omentectomy, argon beam coag, diaphragm stripping    Past Medical Hx:  Past Medical History:  Diagnosis Date  . BRCA2 gene mutation positive in female   . Diverticulosis   . Genital herpes   . Melanoma ([place])   . Osteoarthritis   . Ovarian cancer ([place])   . Thyroid nodule   . Tinnitus     Past Gynecological History:   GYNECOLOGIC HISTORY:  No LMP recorded. Menarche: 67 years old P 0   Family Hx:  Family History  Problem Relation Age of Onset  . Ovarian cancer Mother   . Breast cancer Maternal Grandmother   . Uterine cancer Maternal Aunt   . BRCA 1/2 Sister        breast cancer    Review of Systems:  Review of Systems  Psychiatric/Behavioral:  The patient is nervous/anxious.   All other systems  reviewed and are negative.    Vitals:  Blood pressure (!) 143/70, pulse 78, temperature 98.2 F (36.8 C), temperature source Oral, resp. rate 20, height '5\' 4"'  (1.626 m), weight 115 lb 14.4 oz (52.6 kg), SpO2 99 %.  Physical Exam: ECOG PERFORMANCE STATUS: 0 - Asymptomatic   General :  Well developed, 67 y.o., female in no apparent distress HEENT:  Normocephalic/atraumatic, symmetric, EOMI, eyelids normal Neck:   Supple, no masses.  Lymphatics:  No cervical/ submandibular/ supraclavicular/ infraclavicular/ inguinal adenopathy Respiratory:  Respirations unlabored, no use of accessory muscles CV:   Deferred Breast:  Deferred Musculoskeletal: No CVA tenderness, normal muscle strength. Abdomen: Soft, non-tender and nondistended. No evidence of hernia. No masses. Extremities:  No lymphedema, no erythema, non-tender. Skin:   Normal inspection Neuro/Psych:  No focal motor deficit, no abnormal mental status. Normal gait. Normal affect. Alert and oriented to person, place, and time  Genito Urinary 01/13/2018:  Vulva:  Normal external female genitalia.   Bladder/urethra:  Urethral meatus normal in size and location. No lesions or masses, well supported bladder  Vagina: No lesion, no discharge, no bleeding.  Cervix/Uterus/Adnexa: Surgically absent  Bimanual: No masses no lesions  Rectovaginal:  Good tone, no masses, no cul de sac nodularity, no parametrial involvement or nodularity.   Oncologic Summary: 1. Stage 3C (vs 4) Ovarian Cancer  - s/p Debulking Jan 2015 at [place] (Dr. Gifford Shave) ELAP/TAH/BSO/Splenectomy/ TColon rsxn with RA 08/20/13   - splenic pathology with "superficial parenchymal disease"  - s/p 6 cycles IV/IP Taxol/Cis/Taxol 01/06/18 2. Recurrent disease Feb 2018 by rising CA125 and imaging  - Carbo/Taxol Q3 week 11/30/17-03/14/17 (partial response)  - Niraparib 05/02/17 - present   Start 377m with thrombocytopenia requiring dose  reduction   Decrease to 2030m10/2018 with anemia   Decreased to 100 mg QOD /200 mg QOD 12/16/2017 3. 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 BRCA2 predispositions  . Breast screening   - [place] [facility] breast MRI    - NOTE she has vacation plans in mid-Sept so we'll have to work around that with scheduling. We agreed on end of August.   - [place]   - Mastectomy briefly discussed. I would imagine until we have her NED for over a year the surgeons would not offer.  . Pancreatic risks   - we discussed referral to GI at some point to discuss options. For now we agreed on annual CA19-9 (QLoney Hering2. Port was placed by IR.  . Marland Kitchenlush here each visit [facility]. Recurrent ovarian cancer  . tumor profiling has been discussed on previous visits; we'll see if she progresses on PARP before revisiting  . CA 125 is her MOD and will be checked monthly; I rereviewed this with her today and we wrote lab plans on her personal calendar. She was also given a calendar by [employer]. PARP maintenance continues, which was started with measurable disease on imaging   - [facility] and she appeared to be NED.     - there was some discussion of ? PNA versus malignant process in the lungs. Followup CXR was done and normal and now on the chart  Next scan will be planned for  mid July 2019 at [facility] closer to home (6 months from prior) 4. Thrombocytopenia  . likely due to PARP  . Had count down to 9000 and transfused 05/2017 with hold and dose reduction on PARP  No issues since initial reduction 5. Anemia  . Had transfusion 2UPRBC 08/27/17 after being on 24m dose of niraparib 3 months.  . Noted to show slow drift since that time. 2/15 =  11; 2/20 = 11.3; 3/1 = 10.4.  . Held again since her last visit so restart was 200 QOD // 100 QOD to give her an average dosing of 1544mdaily.  This was started 5/6 and will continue for now  I will order Q2 week CBC x 1 month and then if stable we'll go to monthly with the other labs. 6. PARP management  . monthly BMP, LFT's, CA125, CBC (however see note above re: anemia)  . refills through [facility] ([phone] 7.  Neck pain radiating down left shoulder  . Cspine plain films - 4 view - normal. Defer to PCP 8.  She asked me today about donation/research options once she passes. [facility] 9.  Trouble sleeping - I think it's worth it to take the PARP in the morning. I explained this may make her experience more nausea, but if she can manage and that helps her sleep better it is reasonable. She states the reason she wakes up at night is because she drinks so much water with the PARP otherwise she gets "cotton mouth" and that waking up to void at 4-5am is the issue because she cannot get back to sleep. I encouraged her to avoid stimulating activities prior to falling asleep (electronics specifically) 10. RTC Q 3 months for pelvic and monitoring of labs.    ShIsabel CapriceMD  01/14/2018, 10:37 AM  Cc: MaGifford ShaveMD ([facility]) AmHarlon DittyMD (PCP)

## 2018-01-13 NOTE — Patient Instructions (Signed)
1) Plan to have a CBC with diff on January 27, 2018 at [facility].  They should be faxing the results to [phone].  2) Plan to have a CBC with diff, Bmet, Liver function tests, and CA 125 on February 10, 2018 at [facility] then again on March 10, 2018 then monthly.  Have results faxed to [phone].  3) Plan to have a CT scan of the chest, abdomen, and pelvis in July 2019 at [facility] in [place].  We will reach out to your insurance to have this prior authorized then it can be scheduled.   4) Plan to return to GYN Oncology in [place] with Dr. Gerarda Fraction at the end of August.  We will plan on having your breast MRI and lab work on that time of your appointment.

## 2018-01-14 ENCOUNTER — Telehealth: Payer: Self-pay

## 2018-01-14 ENCOUNTER — Encounter: Payer: Self-pay | Admitting: Obstetrics

## 2018-01-14 ENCOUNTER — Telehealth: Payer: Self-pay | Admitting: *Deleted

## 2018-01-14 DIAGNOSIS — Z1501 Genetic susceptibility to malignant neoplasm of breast: Secondary | ICD-10-CM | POA: Insufficient documentation

## 2018-01-14 DIAGNOSIS — Z1509 Genetic susceptibility to other malignant neoplasm: Secondary | ICD-10-CM

## 2018-01-14 LAB — CA 125: CANCER ANTIGEN (CA) 125: 11.4 U/mL (ref 0.0–38.1)

## 2018-01-14 NOTE — Telephone Encounter (Signed)
Faxed orders and insurance authorization to [employer]

## 2018-01-14 NOTE — Telephone Encounter (Signed)
Per Joylene John NP, results of CA 125 given as 11.4 to patient via phone at this time. Pt voiced understanding.  No needs per pt at this time.

## 2018-01-15 ENCOUNTER — Encounter: Payer: Self-pay | Admitting: Obstetrics

## 2018-01-15 ENCOUNTER — Telehealth: Payer: Self-pay

## 2018-01-15 ENCOUNTER — Telehealth: Payer: Self-pay | Admitting: *Deleted

## 2018-01-15 NOTE — Telephone Encounter (Signed)
-----   Message from Isabel Caprice, MD sent at 01/14/2018 10:39 AM EDT ----- Will you see if patient can activate her my chart and then we can message each other from time to time?

## 2018-01-15 NOTE — Telephone Encounter (Signed)
Faxed lab results (CMET) to [facility]

## 2018-01-15 NOTE — Telephone Encounter (Signed)
Spoke with Ms. Perko and she thinks that it would be a great idea to activate my char. Told Ms Mcginniss that the Activation and temporary code is on the AVS given to her on 01-13-18 at her visit. She said that she would activate it today.

## 2018-01-24 ENCOUNTER — Other Ambulatory Visit: Payer: Medicare Other

## 2018-01-24 ENCOUNTER — Ambulatory Visit: Payer: Medicare Other | Admitting: Obstetrics

## 2018-01-28 ENCOUNTER — Telehealth: Payer: Self-pay | Admitting: Oncology

## 2018-01-28 NOTE — Telephone Encounter (Signed)
[facility] and they did not have any lab results from yesterday.  Called Kaityln and she said she had company over the weekend and forgot about getting her labs drawn yesterday.  She will go today.

## 2018-01-29 ENCOUNTER — Telehealth: Payer: Self-pay

## 2018-01-29 NOTE — Telephone Encounter (Signed)
Outgoing call to patient per Joylene John NP regarding her results of recent hemoglobin as 11.2.  Also to remind her of upcoming lab tests on July 1st and July 29th then monthly.  Pt voiced understanding. She is also aware of upcoming appts here on 8/23.  No other needs per pt at this time.

## 2018-01-31 ENCOUNTER — Telehealth: Payer: Self-pay

## 2018-01-31 NOTE — Telephone Encounter (Signed)
Call received from [facility] regarding refill- per Joylene John NP- have [facility] send a refill reminder fax - representative will fax as requested but they had the wrong fax number- updated them with our fax number and now they will send.  No other needs per [facility] at this time.

## 2018-02-04 ENCOUNTER — Other Ambulatory Visit: Payer: Self-pay | Admitting: Gynecologic Oncology

## 2018-02-04 DIAGNOSIS — C569 Malignant neoplasm of unspecified ovary: Secondary | ICD-10-CM

## 2018-02-05 ENCOUNTER — Other Ambulatory Visit: Payer: Self-pay

## 2018-02-05 DIAGNOSIS — C569 Malignant neoplasm of unspecified ovary: Secondary | ICD-10-CM

## 2018-02-05 MED ORDER — NIRAPARIB TOSYLATE 100 MG PO CAPS: 100.0000 mg | ORAL_CAPSULE | ORAL | 0 refills | Status: DC

## 2018-02-05 NOTE — Telephone Encounter (Signed)
Faxed new order to [facility] for refill per Dr. Gerarda Fraction. Faxed prescription to [facility] [phone].

## 2018-02-10 ENCOUNTER — Telehealth: Payer: Self-pay | Admitting: Oncology

## 2018-02-10 NOTE — Telephone Encounter (Signed)
[facility] and verified that patient had a BMET, liver function test and CA 125 today.  They said she did have them drawn and they are still in process.  The CA 125 test will be sent out.  They will fax the results when they are ready.

## 2018-02-11 ENCOUNTER — Telehealth: Payer: Self-pay

## 2018-02-11 NOTE — Telephone Encounter (Signed)
Told Madeline Perry that her counts and chemistries are good to continue her Niraparib at current dosing 100 mg alt with 200 mg per Dr. Gerarda Fraction. One of her liver enzymes was slightly evevated and Dr. Gerarda Fraction will wath with next lab draw. Pt verbalized understanding.

## 2018-03-05 ENCOUNTER — Telehealth: Payer: Self-pay | Admitting: Gynecologic Oncology

## 2018-03-05 NOTE — Telephone Encounter (Signed)
Patient informed of CT scan.  Results discussed in detail.  No concerns voiced at the end of the visit.

## 2018-03-10 ENCOUNTER — Other Ambulatory Visit: Payer: Self-pay

## 2018-03-10 ENCOUNTER — Telehealth: Payer: Self-pay | Admitting: *Deleted

## 2018-03-10 DIAGNOSIS — C569 Malignant neoplasm of unspecified ovary: Secondary | ICD-10-CM

## 2018-03-10 MED ORDER — NIRAPARIB TOSYLATE 100 MG PO CAPS: 100.0000 mg | ORAL_CAPSULE | ORAL | 0 refills | Status: DC

## 2018-03-10 NOTE — Telephone Encounter (Signed)
Faxed orders to [facility] for port flushes to the [place]

## 2018-03-11 ENCOUNTER — Telehealth: Payer: Self-pay | Admitting: Gynecologic Oncology

## 2018-03-11 NOTE — Telephone Encounter (Signed)
Called patient and informed her of recent labs at [place] that the AST was slightly elevated at 42 and it would be closely monitored.  Also notified of CA 125 of 10.7.  No concerns voiced.  Advised to call for any needs.

## 2018-04-04 ENCOUNTER — Encounter: Payer: Self-pay | Admitting: Obstetrics

## 2018-04-04 ENCOUNTER — Inpatient Hospital Stay: Payer: Medicare Other

## 2018-04-04 ENCOUNTER — Inpatient Hospital Stay: Payer: Medicare Other | Attending: Gynecologic Oncology | Admitting: Obstetrics

## 2018-04-04 ENCOUNTER — Ambulatory Visit (HOSPITAL_COMMUNITY)
Admission: RE | Admit: 2018-04-04 | Discharge: 2018-04-04 | Disposition: A | Discharge status: Home / Self Care | Payer: Medicare Other | Source: Ambulatory Visit | Attending: Gynecologic Oncology | Admitting: Gynecologic Oncology

## 2018-04-04 VITALS — BP 150/76 | HR 73 | Temp 98.5°F | Resp 18 | Ht 64.0 in | Wt 114.0 lb

## 2018-04-04 DIAGNOSIS — C569 Malignant neoplasm of unspecified ovary: Secondary | ICD-10-CM

## 2018-04-04 DIAGNOSIS — Z1509 Genetic susceptibility to other malignant neoplasm: Secondary | ICD-10-CM | POA: Insufficient documentation

## 2018-04-04 DIAGNOSIS — Z1501 Genetic susceptibility to malignant neoplasm of breast: Secondary | ICD-10-CM | POA: Insufficient documentation

## 2018-04-04 DIAGNOSIS — Z90710 Acquired absence of both cervix and uterus: Secondary | ICD-10-CM

## 2018-04-04 DIAGNOSIS — C801 Malignant (primary) neoplasm, unspecified: Principal | ICD-10-CM

## 2018-04-04 DIAGNOSIS — Z9221 Personal history of antineoplastic chemotherapy: Secondary | ICD-10-CM | POA: Insufficient documentation

## 2018-04-04 DIAGNOSIS — Z90722 Acquired absence of ovaries, bilateral: Secondary | ICD-10-CM | POA: Diagnosis not present

## 2018-04-04 DIAGNOSIS — Z8543 Personal history of malignant neoplasm of ovary: Secondary | ICD-10-CM | POA: Diagnosis not present

## 2018-04-04 DIAGNOSIS — Z95828 Presence of other vascular implants and grafts: Secondary | ICD-10-CM

## 2018-04-04 LAB — CBC WITH DIFFERENTIAL (CANCER CENTER ONLY)
BASOS PCT: 1 %
Basophils Absolute: 0 10*3/uL (ref 0.0–0.1)
EOS ABS: 0.1 10*3/uL (ref 0.0–0.5)
Eosinophils Relative: 1 %
HEMATOCRIT: 35.4 % (ref 34.8–46.6)
Hemoglobin: 11.8 g/dL (ref 11.6–15.9)
Lymphocytes Relative: 26 %
Lymphs Abs: 1.4 10*3/uL (ref 0.9–3.3)
MCH: 29.1 pg (ref 25.1–34.0)
MCHC: 33.3 g/dL (ref 31.5–36.0)
MCV: 87.4 fL (ref 79.5–101.0)
MONOS PCT: 12 %
Monocytes Absolute: 0.7 10*3/uL (ref 0.1–0.9)
NEUTROS ABS: 3.4 10*3/uL (ref 1.5–6.5)
Neutrophils Relative %: 60 %
Platelet Count: 217 10*3/uL (ref 145–400)
RBC: 4.05 MIL/uL (ref 3.70–5.45)
RDW: 15.1 % — AB (ref 11.2–14.5)
WBC Count: 5.6 10*3/uL (ref 3.9–10.3)

## 2018-04-04 LAB — BASIC METABOLIC PANEL
Anion gap: 7 (ref 5–15)
BUN: 14 mg/dL (ref 8–23)
CALCIUM: 9.2 mg/dL (ref 8.9–10.3)
CO2: 29 mmol/L (ref 22–32)
CREATININE: 0.94 mg/dL (ref 0.44–1.00)
Chloride: 104 mmol/L (ref 98–111)
GFR calc non Af Amer: >60 mL/min (ref >60)
Glucose, Bld: 150 mg/dL — ABNORMAL HIGH (ref 70–99)
Potassium: 4.2 mmol/L (ref 3.5–5.1)
Sodium: 140 mmol/L (ref 135–145)

## 2018-04-04 LAB — HEPATIC FUNCTION PANEL
ALBUMIN: 4.2 g/dL (ref 3.5–5.0)
ALT: 23 U/L (ref 0–44)
AST: 34 U/L (ref 15–41)
Alkaline Phosphatase: 70 U/L (ref 38–126)
BILIRUBIN DIRECT: 0.1 mg/dL (ref 0.0–0.2)
BILIRUBIN TOTAL: 0.7 mg/dL (ref 0.3–1.2)
Indirect Bilirubin: 0.6 mg/dL (ref 0.3–0.9)
Total Protein: 6.8 g/dL (ref 6.5–8.1)

## 2018-04-04 LAB — POCT I-STAT CREATININE: Creatinine, Ser: 0.9 mg/dL (ref 0.44–1.00)

## 2018-04-04 MED ORDER — GADOBENATE DIMEGLUMINE 529 MG/ML IV SOLN
10.0000 mL | Freq: Once | INTRAVENOUS | Status: AC | PRN
  Administered 2018-04-04: 10 mL via INTRAVENOUS

## 2018-04-04 MED ORDER — HEPARIN SOD (PORK) LOCK FLUSH 100 UNIT/ML IV SOLN
500.0000 [IU] | Freq: Once | INTRAVENOUS | Status: AC
  Administered 2018-04-04: 500 [IU]
  Filled 2018-04-04: qty 5

## 2018-04-04 MED ORDER — SODIUM CHLORIDE 0.9% FLUSH
10.0000 mL | Freq: Once | INTRAVENOUS | Status: AC
  Administered 2018-04-04: 10 mL
  Filled 2018-04-04: qty 10

## 2018-04-04 NOTE — Progress Notes (Signed)
Consult Note: Gyn-Onc  CC:  Chief Complaint  Patient presents with  . Malignant neoplasm of ovary, unspecified laterality [facility])    HPI: [name]  is a very nice 67 y.o.  year old P0  Interval History: Recall she was treated for ovarian cancer originally by Dr. Gifford Shave at [facility] [employer] in [place].  She returns today to follow-up on PARP maintenance.  She continues on the regimen which gives her essentially a 134m daily on average dosing. She started this 12/16/17 and labs since have been stable.   She had a breast MRI today. Results pending. Also had labs drawn today.  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  No complaints by the end of chemotherapy treatment  (per PCP) Thyroid nodule-PCP managing.  Biopsy has been done and reportedly benign  (resolved) Vaginal spotting April 11, 2017-described as pink and watery staining underwear to times not enough to leak through.  Denies increased activity surrounding this episode.  Felt to be atrophy based on my exam at that time.  (improved after changing PARP to am) having issues at home and having trouble sleeping.   (stable - improved) Neck radiating pain - CSpine xray was normal 11/06/17; tolerating by trying to avoid movement that incites the pain  (sister doing well) - sister diagnosed summer 2019 with breast cancer  PARP Issues: . Thrombocytopenia   3rd week after standard dosing of 300 with platelets down to 20 K.  We held her treatment May 22, 2017.  Repeat platelet May 28, 2017 was 9K and she was transfused.  She was then dose reduced down to 200   anemia which required transfusion in late 2018 . Anemia  cycle 3 observed to 8.9.  Iron studies were ordered and normal.    08/27/2017 she did have anemia significant enough to require transfusion she  was held and restarted   [place] Q[place]   Measurement of disease:  CA 125 and imaging Ca125 08/18/2013 preop > 4000 06/2015 = 11.9 03/2016 = 16___________________ Recurrence (2.5 years disease free) 09/2016 = 45 recheck 63 11/08/2016 = 292 12/21/2016 = 91 (cycle 2) 01/09/2017 = 17.1 (cycle 3) 03/14/2017 = 14 (cycle 6)   end of treatment_____ Niraparib start 05/02/2017 05/08/2017 = 11.2 (C1) 05/28/2017 = 9 (C2) 06/27/2017 = 13 (C3) 07/2017 = ______(C4) 08/14/2017 = 10 (C5) CHANGE Facility of care 10/2017 = 12.4 (C7) 11/2017 = 6.7 (C8) 12/2017 = missed due to misunderstanding with lab draws(C9) 01/2018 = 11.4 (C10) 02/2018  = 10.7 03/2018 = draw today (C12)  Oncology History   She presented in December 2014 to her PCP with pelvic pain and imaging was performed.  A pelvic mass was found and she made her way to Dr. CIsaiah Blakes  She then underwent IV/IP Taxol/cis/Taxol for 6 cycles.  Her Ca125 preop was >4000.  It looks to have fallen nicely during treatment and was 11.9 in November 2016 1 year after her treatment ended.  She was found to have a BRCA2 mutation, documented now in the chart, from myriad my risk testing March 2015.  Recurrence February 2018.  Received 6 cycles of IV carboplatin Taxol. Issues with second line chemotherapy included neuropathy and petechiae, now resolved, in lower extremities, but no thrombocytopenia.  PARP maintenance  05/02/2017. Experienced thrombocytopenia and anemia.      Malignant neoplasm of ovary ([place])   10/2013 Genetic Testing    Patient has genetic testing done for her ovarian cancer diagnosis. 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 07/31/2013-UTS W-peritoneal carcinomatosis omental caking large mass in the pelvis liver and  spleen do not have any focal or diffuse parenchymal abnormalities.  Calcification in the right lung base probable granuloma.  Ascites. . 11/08/2016-RT a-a heterogeneous nodule from the posterior right thyroid measuring 1.7 x 1.3.  Multiple metastatic lesions in the liver including a segment 3 lesion 3.3 x 2.9 cm and a segment 7 lesion measuring 3.2 x 3 cm.  In the pelvis there is a soft tissue mass more superiorly measuring 2.4 x 1.6 cm and then inferior to this measuring 2.8 x 2.3 cm.  Addendum compared to prior there are no significant differences in the overall interpretation.  Note that the thyroid lesion was not present on the prior. . 12/21/2016-thyroid ultrasound right-sided thyroid nodules recommend 3-93-monthfollow-up. . 02/11/2017-CT CAP-[facility]-hepatic metastases have decreased in size or resolved compared to March 2018 index lesions remaining today include 14 mm right hepatic lobe; 11 x 11 mm right hepatic lobe abutting the caudate lobe; 7 mm anterior right hepatic lobe; 16 mm lateral segment left hepatic lobe.  Left pelvic mass no longer seen. . 02/11/2017-PET-[facility]-liver demonstrates areas of diminished FDG avidity consistent with cystlike changes no distinct FDG avid abnormalities noted; longitudinally oriented increased activity along the lateral aspect of the sigmoid colon at the mid pelvis SUV 3.11. . 08/26/2017-RTA-CT CAP-large calcified granuloma right middle lobe.  Focal area of masslike consolidation right middle lobe which is new from prior study 2.3 cm pneumonia versus malignancy.  Degenerative changes in the spine with convex scoliosis at the thoracic lumbar junction.  Diffuse fatty infiltration of the liver has decreased.  Low-attenuation mass posterior superior segment right lobe of the liver measures 0.9 cm versus 1.8 cm February 11, 2017.  Subtle low-attenuation lesion in the posterior superior segment of the right lobe of the liver measuring 0.6 cm on current exam versus 1.1 cm  02/11/2017.  Low-attenuation lesion in the left lobe of the liver which measures 0.7 cm versus 1.6 cm July 2018.  No new or progressive liver lesions seen.  Diverticulosis.  Mild circumferential wall thickening involving the sigmoid colon left pelvis which does correlate with an area of uptake on a PET July 2018.  No peritoneal based soft tissue mass demonstrated.  No adnexal mass.      Tumor Marker    Ca125 08/18/2013 preop > 4000       Current Meds:  Outpatient Encounter Medications as of 04/04/2018  Medication Sig  . Ascorbic Acid (VITAMIN C PO) Take by mouth.  .Marland KitchenGLUTAMINE PO Take by mouth daily.   .Alanda SlimTosylate 100 MG CAPS Take 100 mg by mouth as directed. Alternate between 100 mg daily then 200 mg on alternate days x 1 mth With or without food at the same time daily. May take at bedtime to manage nausea  . Pyridoxine HCl (VITAMIN B-6 PO) Take by mouth.  . vitamin B-12 (CYANOCOBALAMIN) 1000 MCG tablet Take 1,000 mcg by mouth daily.   No facility-administered encounter medications on file as of 04/04/2018.     Allergy: No  Known Allergies  Social Hx:   Social History   Socioeconomic History  . Marital status: Married    Spouse name: Not on file  . Number of children: Not on file  . Years of education: Not on file  . Highest education level: Not on file  Occupational History  . Not on file  Social Needs  . Financial resource strain: Not on file  . Food insecurity:    Worry: Not on file    Inability: Not on file  . Transportation needs:    Medical: Not on file    Non-medical: Not on file  Tobacco Use  . Smoking status: Never Smoker  . Smokeless tobacco: Never Used  Substance and Sexual Activity  . Alcohol use: Yes    Comment: occasional  . Drug use: No  . Sexual activity: Yes  Lifestyle  . Physical activity:    Days per week: Not on file    Minutes per session: Not on file  . Stress: Not on file  Relationships  . Social connections:    Talks on phone: Not  on file    Gets together: Not on file    Attends religious service: Not on file    Active member of club or organization: Not on file    Attends meetings of clubs or organizations: Not on file    Relationship status: Not on file  . Intimate partner violence:    Fear of current or ex partner: Not on file    Emotionally abused: Not on file    Physically abused: Not on file    Forced sexual activity: Not on file  Other Topics Concern  . Not on file  Social History Narrative  . Not on file    Past Surgical Hx:  Past Surgical History:  Procedure Laterality Date  . AXILLARY LYMPH NODE DISSECTION  1974   right for melanoma followed by chemo  . BUNIONECTOMY Left 08/1984  . PORTA CATH INSERTION  08/20/2013   INTRAPERITONEAL at the time of TAH, BSO; removed 2015  . Glori Luis CATH INSERTION  05/2017   [facility]  . TOTAL ABDOMINAL HYSTERECTOMY W/ BILATERAL SALPINGOOPHORECTOMY  08/20/2013   TAH, BSO, tumor debulking, bowel resection with reanastomosis, splenectomy, omentectomy, argon beam coag, diaphragm stripping    Past Medical Hx:  Past Medical History:  Diagnosis Date  . BRCA2 gene mutation positive in female   . Diverticulosis   . Genital herpes   . Melanoma ([place])   . Osteoarthritis   . Ovarian cancer ([place])   . Thyroid nodule   . Tinnitus     Past Gynecological History:   GYNECOLOGIC HISTORY:  No LMP recorded. Menarche: 67 years old P 0   Family Hx:  Family History  Problem Relation Age of Onset  . Ovarian cancer Mother   . Breast cancer Maternal Grandmother   . Uterine cancer Maternal Aunt   . BRCA 1/2 Sister        breast cancer    Review of Systems:  Review of Systems  Neurological: Positive for numbness.  All other systems reviewed and are negative.    Vitals:  Blood pressure (!) 150/76, pulse 73, temperature 98.5 F (36.9 C), temperature source Oral, resp. rate 18, height _0  (1.626 m), weight 114 lb (51.7 kg), SpO2 100 %.  Physical  Exam: ECOG PERFORMANCE STATUS: 0 - Asymptomatic   General :  Well developed, 67 y.o., female in no apparent distress HEENT:  Normocephalic/atraumatic, symmetric,  EOMI, eyelids normal Neck:   Supple, no masses.  Lymphatics:  No cervical/ submandibular/ supraclavicular/ infraclavicular/ inguinal adenopathy Respiratory:  Respirations unlabored, no use of accessory muscles CV:   Deferred Breast:  Deferred Musculoskeletal: No CVA tenderness, normal muscle strength. Abdomen: Soft, non-tender and nondistended. No evidence of hernia. No masses. Extremities:  No lymphedema, no erythema, non-tender. Skin:   Normal inspection Neuro/Psych:  No focal motor deficit, no abnormal mental status. Normal gait. Normal affect. Alert and oriented to person, place, and time  Genito Urinary 04/04/2018:  Vulva:  Normal external female genitalia.   Bladder/urethra:  Urethral meatus normal in size and location. No lesions or masses, well supported bladder  Vagina: No lesion, no discharge, no bleeding.  Cervix/Uterus/Adnexa: Surgically absent  Bimanual: No masses no lesions  Rectovaginal:  Deferred today  Oncologic Summary: 1. Stage 3C (vs 4) Ovarian Cancer  - s/p Debulking Jan 2015 at [place] (Dr. Gifford Shave) ELAP/TAH/BSO/Splenectomy/ TColon rsxn with RA 08/20/13   - splenic pathology with "superficial parenchymal disease"  - s/p 6 cycles IV/IP Taxol/Cis/Taxol 01/06/18 2. Recurrent disease Feb 2018 by rising CA125 and imaging  - Carbo/Taxol Q3 week 11/30/17-03/14/17 (partial response)  - Niraparib 05/02/17 - present   Start 324m with thrombocytopenia requiring dose reduction   Decrease to 2081m10/2018 with anemia   Decreased to 100 mg QOD /200 mg QOD 12/16/2017 3. 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 BRCA2 predispositions  . Breast screening   - [facility] for breast MRI    - NOTE she has  vacation plans in mid-Sept so we'll have to work around that with scheduling. We agreed on end of August and she just had that.   - [place]   - Mastectomy briefly discussed. She is not ready to proceed at this point. When she is ready she would rather see someone here   . Pancreatic risks   - we discussed referral to GI at some point to discuss options. For now we agreed on annual CA19-9 (QLoney Hering2. Port was placed by IR.  . Marland Kitchenlush here each visit and [facility]. Recurrent ovarian cancer  . tumor profiling has been discussed on previous visits; we'll see if she progresses on PARP before revisiting  . CA 125 is her MOD and will be checked monthly; I rereviewed this with her today and [facility]. PARP maintenance continues, which was started with measurable disease on imaging   - last CT CAP done 03/03/18 and is scanned under "Media". We reviewed this report together today and not she appeared to be NED.  - early 2019 there was some discussion of ? PNA versus malignant process in the lungs. Followup CXR was done and normal. Some probable residual on the chest portion of the 02/2018 CT with no new lesions  Next scan will be planned for 6 months which will be Jan 2020. [place] 4. Thrombocytopenia  . likely due to PARP  . Had count down to 9000 and transfused 05/2017 with hold and dose reduction on PARP  No issues since initial reduction 5. Anemia  . Had transfusion 2UPRBC 08/27/17 after being on 20094mose of niraparib 3 months.  . Noted to show slow drift since that time.  2/15 = 11; 2/20 = 11.3; 3/1 = 10.4.  . Now stable on 200 QOD // 100 QOD to give her an average dosing of 160m daily.  This was started 12/16/2017  and will continue for now  Now on monthly lab draws 6. PARP management  . monthly BMP, LFT's, CA125, CBC   . refills through  [employer] ([id number] PRN 7.  She asked me on prior visits about donation/research options once she passes. [facility] and [facility]  Trouble sleeping - Improved changing PARP to am dosing 9. RTC Q 3 months for pelvic and monitoring of labs and port flush   SIsabel Caprice MD  04/04/2018, 2:32 PM  Cc: MGifford Shave MD ([facility]) AHarlon Ditty MD (PCP)

## 2018-04-04 NOTE — Patient Instructions (Addendum)
1. We'll continue to monitor your labs monthly. We will give you a calendar with this again 2. Return in 3 months. 3. Continue PARP at same dosing

## 2018-04-05 LAB — CA 125: CANCER ANTIGEN (CA) 125: 11.4 U/mL (ref 0.0–38.1)

## 2018-04-08 ENCOUNTER — Telehealth: Payer: Self-pay

## 2018-04-08 ENCOUNTER — Telehealth: Payer: Self-pay | Admitting: Oncology

## 2018-04-08 NOTE — Telephone Encounter (Signed)
Faxed lab orders to [facility] at [phone] and port flush order for October to [phone].

## 2018-04-08 NOTE — Telephone Encounter (Signed)
Told MS Mcglade that the liverfunction tests were within normal limits and that the MRI showed no evidence of malignancy in either breast per Melissa Cross,NP.

## 2018-05-07 ENCOUNTER — Telehealth: Payer: Self-pay | Admitting: *Deleted

## 2018-05-07 NOTE — Telephone Encounter (Signed)
Called and moved appt from 11/22 to 11/27

## 2018-05-09 ENCOUNTER — Telehealth: Payer: Self-pay | Admitting: Oncology

## 2018-05-09 NOTE — Telephone Encounter (Signed)
Entered in error

## 2018-05-14 ENCOUNTER — Telehealth: Payer: Self-pay | Admitting: Oncology

## 2018-05-14 NOTE — Telephone Encounter (Signed)
Called Madeline Perry to see if she had labs drawn in September.  She said that since she had labs drawn in late August, she didn't think she needed them again until October.  Asked if she can have them drawn soon and she said she will go tomorrow.

## 2018-05-15 ENCOUNTER — Telehealth: Payer: Self-pay | Admitting: Oncology

## 2018-05-15 NOTE — Telephone Encounter (Signed)
Madeline Perry left a message about needing lab orders.  Called her back and she said the lab had found the orders and she also had a port flush done.  She said to let her know if we do not receive the results.

## 2018-05-16 ENCOUNTER — Telehealth: Payer: Self-pay

## 2018-05-16 NOTE — Telephone Encounter (Signed)
Told Ms Clune that all of her labs done yesterday looked good per Dr. Gerarda Fraction.

## 2018-06-12 ENCOUNTER — Telehealth: Payer: Self-pay

## 2018-06-12 NOTE — Telephone Encounter (Signed)
Attempted to reach patient again per Joylene John NP regarding recent lab results (see previous nurse note) - no answer, left VM with contact info for office.

## 2018-06-12 NOTE — Telephone Encounter (Signed)
Outgoing call to patient regarding recent lab results per Joylene John NP.  No answer, left VM to return call to our office w/ our contact info.

## 2018-06-13 ENCOUNTER — Telehealth: Payer: Self-pay

## 2018-06-13 NOTE — Telephone Encounter (Signed)
Told Madeline Perry that her Hgb was 11.9, AST slightly elevated at 42, and her CA-125 was good at 9.2 Continue with niraparib and follow up as planned.

## 2018-06-23 ENCOUNTER — Other Ambulatory Visit: Payer: Self-pay

## 2018-06-23 DIAGNOSIS — C569 Malignant neoplasm of unspecified ovary: Secondary | ICD-10-CM

## 2018-06-23 MED ORDER — NIRAPARIB TOSYLATE 100 MG PO CAPS: 100.0000 mg | ORAL_CAPSULE | ORAL | 3 refills | Status: DC

## 2018-07-04 ENCOUNTER — Other Ambulatory Visit: Payer: Medicare Other

## 2018-07-04 ENCOUNTER — Ambulatory Visit: Payer: Medicare Other | Admitting: Obstetrics

## 2018-07-08 NOTE — Progress Notes (Signed)
Madeline Perry at [facility]    Progress Note : Established Patient FOLLOW-UP  CC:  Chief Complaint  Patient presents with  . Malignant neoplasm of ovary, unspecified laterality [facility])   GYN Oncologic Summary: 1. Stage 3C (vs 4) Ovarian Cancer  - s/p Debulking Jan 2015 at [place] (Dr. Gifford Perry) ELAP/TAH/BSO/Splenectomy/ TColon rsxn with RA 08/20/13   - splenic pathology with "superficial parenchymal disease"  - s/p 6 cycles IV/IP Taxol/Cis/Taxol 01/06/18 2. Recurrent disease Feb 2018 by rising CA125 and imaging  - Carbo/Taxol Q3 week 11/30/17-03/14/17 (partial response)  - Niraparib 05/02/17 - present   Start 365m with thrombocytopenia requiring dose reduction   Decrease to 2059m10/2018 with anemia   Decreased to 100 mg QOD /200 mg QOD 12/16/2017 3. 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 Madeline Giebleris a very nice [age].o.  year old P0  Interval History: Recall she was treated for ovarian cancer originally by Dr. [facility]ifford [facility] [employer] [place] She returns today to follow-up on PARP maintenance.  She continues on the regimen which gives her essentially a 15035maily on average dosing. She started this new dosing regimen 12/16/17 and labs since have been stable. She has some elevation in her AST that has been <1.5 x ULN. These [facility] Q3 month check it has been normal.  No new CT body imaging since last visit  She is doing well. No complaints.  Other screening studies MM[facility]23[facility]17 [facility] - late 03/2018 negative CA19-9 was normal here 10/23/17 at 26  General Issues on prior visits:  (stable) -- Neuropathy was notable with her second line platinum Taxol therapy.  No complaints by the end of chemotherapy treatment  (per PCP) Thyroid nodule-PCP managing.  Biopsy has been done and reportedly benign  (resolved) Vaginal spotting April 11, 2017-described as pink and watery staining underwear to times not enough to leak through.  Denies increased activity surrounding this episode.  Felt to be atrophy based on my exam at that time.  (improved after changing PARP to am) having issues at home and having trouble sleeping.   (stable - improved) Neck radiating pain - CSpine xray was normal 11/06/17; tolerating by trying to avoid movement that incites the pain  (sister doing well) - sister diagnosed summer 2019 with breast cancer  PARP Issues: . Thrombocytopenia   3rd week after standard dosing of 300 with platelets down to 20 K.  We held her treatment May 22, 2017.  Repeat platelet May 28, 2017 was 9K and she was transfused.  She was then dose reduced down to 200  . Anemia  anemia which required transfusion in late 2018 while she was on 200 daily  cycle 3 observed to 8.9.  Iron studies were ordered and normal.    08/27/2017 she did have anemia significant enough to require transfusion she was held and restarted at 200 daily  Still anemic 10/2017 and so we dose reduced [place] new practice [place] 200 QOD/ 100 QOD.   Measurement of disease:  CA 125 and imaging Ca125 08/18/2013 preop > 4000 06/2015 = 11.9 03/2016 = 16___________________ Recurrence (2.5 years disease free) 09/2016 = 45 recheck 63 11/08/2016 = 292 12/21/2016 = 91 (cycle 2) 01/09/2017 = 17.1 (cycle 3) 03/14/2017 = 14 (cycle 6)   end of treatment_____  Niraparib start 05/02/2017 05/08/2017 = 11.2 (C1) 05/28/2017 = 9 (C2) 06/27/2017 = 13 (C3) 07/2017 = ______(C4) 08/14/2017 = 10 (C5) CHANGE Facility of care 10/2017 = 12.4 (C7) 11/2017 = 6.7 (C8) 12/2017 = missed due to misunderstanding with lab draws(C9) 01/2018 = 11.4 (C10) 03/10/2018  = 10.7  03/2018 = 11.4  --- ONE YEAR PARP--- 05/15/18 = 8.3 06/09/18 = 9.2  Recent Labs    10/23/17 0937 01/13/18 1042 04/04/18 1146  CAN125 12.4 11.4 11.4     Oncology History   She presented in December 2014  to her PCP with pelvic pain and imaging was performed.  A pelvic mass was found and she made her way to Dr. Isaiah Perry.  She then underwent IV/IP Taxol/cis/Taxol for 6 cycles.  Her Ca125 preop was >4000.  It looks to have fallen nicely during treatment and was 11.9 in November 2016 1 year after her treatment ended.  She was found to have a BRCA2 mutation, documented now in the chart, from myriad my risk testing March 2015.  Recurrence February 2018.  Received 6 cycles of IV carboplatin Taxol. Issues with second line chemotherapy included neuropathy and petechiae, now resolved, in lower extremities, but no thrombocytopenia.  PARP maintenance 05/02/2017. Experienced thrombocytopenia and anemia.      Malignant neoplasm of ovary ([place])   10/2013 Genetic Testing    Patient has genetic testing done for her ovarian cancer diagnosis. 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 07/31/2013-UTS W-peritoneal carcinomatosis omental caking large mass in the pelvis liver and spleen do not have any focal or diffuse parenchymal abnormalities.  Calcification in the right lung base probable granuloma.  Ascites. . 11/08/2016-RT a-a heterogeneous nodule from the posterior right thyroid measuring 1.7 x 1.3.  Multiple metastatic lesions in the liver including a segment 3 lesion 3.3 x 2.9 cm and a segment 7 lesion measuring 3.2 x 3 cm.  In the pelvis there is a soft tissue mass more superiorly measuring 2.4 x 1.6 cm and then inferior to this measuring 2.8 x 2.3 cm.  Addendum compared to prior there are no significant differences in the overall interpretation.  Note that the thyroid lesion was not present on the prior. . 12/21/2016-thyroid ultrasound right-sided thyroid nodules recommend 3-36-monthfollow-up. . 02/11/2017-CT CAP-[facility]-hepatic  metastases have decreased in size or resolved compared to March 2018 index lesions remaining today include 14 mm right hepatic lobe; 11 x 11 mm right hepatic lobe abutting the caudate lobe; 7 mm anterior right hepatic lobe; 16 mm lateral segment left hepatic lobe.  Left pelvic mass no longer seen. . 02/11/2017-PET-[facility]-liver demonstrates areas of diminished FDG avidity consistent with cystlike changes no distinct FDG avid abnormalities noted; longitudinally oriented increased activity along the lateral aspect of the sigmoid colon at the mid pelvis SUV 3.11. . 08/26/2017-RTA-CT CAP-large calcified granuloma right middle lobe.  Focal area of masslike consolidation right middle lobe which is new from prior study 2.3 cm pneumonia versus malignancy.  Degenerative changes in the spine with convex scoliosis at the thoracic lumbar junction.  Diffuse fatty infiltration of the liver has decreased.  Low-attenuation mass posterior superior segment right lobe of the liver measures 0.9 cm versus 1.8 cm February 11, 2017.  Subtle low-attenuation lesion in the posterior superior segment of the right lobe of the liver measuring 0.6 cm  on current exam versus 1.1 cm 02/11/2017.  Low-attenuation lesion in the left lobe of the liver which measures 0.7 cm versus 1.6 cm July 2018.  No new or progressive liver lesions seen.  Diverticulosis.  Mild circumferential wall thickening involving the sigmoid colon left pelvis which does correlate with an area of uptake on a PET July 2018.  No peritoneal based soft tissue mass demonstrated.  No adnexal mass.  02/2018 residual 7x20m nodule where consolidation in RML seen lung prior. Residual nodule is no changed. Stabele calcified RLL granuloma. 772mhypodensity previously 19m31mcompared to Jan 2019). Other liver hypodenisties seen on prior study not seen now. No new liver lesions. Sigmoid coon thickening resolved. + colonic diverticuli.        Tumor Marker    Ca125 08/18/2013 preop > 4000        Current Meds:  Outpatient Encounter Medications as of 07/09/2018  Medication Sig  . Ascorbic Acid (VITAMIN C PO) Take by mouth.  . GMarland KitchenUTAMINE PO Take by mouth daily.   . NAlanda Slimsylate 100 MG CAPS Take 100 mg by mouth as directed. Alternate between 100 mg daily then 200 mg on alternate days x 1 mth With or without food at the same time daily. May take at bedtime to manage nausea  . Pyridoxine HCl (VITAMIN B-6 PO) Take by mouth.  . vitamin B-12 (CYANOCOBALAMIN) 1000 MCG tablet Take 1,000 mcg by mouth daily.   Facility-Administered Encounter Medications as of 07/09/2018  Medication  . alteplase (CATHFLO ACTIVASE) injection 2 mg  . [COMPLETED] heparin lock flush 100 unit/mL  . [COMPLETED] sodium chloride flush (NS) 0.9 % injection 10 mL  . sodium chloride flush (NS) 0.9 % injection 10 mL    Allergy: No Known Allergies  Social Hx:   Social History   Socioeconomic History  . Marital status: Married    Spouse name: Not on file  . Number of children: Not on file  . Years of education: Not on file  . Highest education level: Not on file  Occupational History  . Not on file  Social Needs  . Financial resource strain: Not on file  . Food insecurity:    Worry: Not on file    Inability: Not on file  . Transportation needs:    Medical: Not on file    Non-medical: Not on file  Tobacco Use  . Smoking status: Never Smoker  . Smokeless tobacco: Never Used  Substance and Sexual Activity  . Alcohol use: Yes    Comment: occasional  . Drug use: No  . Sexual activity: Yes  Lifestyle  . Physical activity:    Days per week: Not on file    Minutes per session: Not on file  . Stress: Not on file  Relationships  . Social connections:    Talks on phone: Not on file    Gets together: Not on file    Attends religious service: Not on file    Active member of club or organization: Not on file    Attends meetings of clubs or organizations: Not on file    Relationship status: Not on  file  . Intimate partner violence:    Fear of current or ex partner: Not on file    Emotionally abused: Not on file    Physically abused: Not on file    Forced sexual activity: Not on file  Other Topics Concern  . Not on file  Social History Narrative  . Not on file  Past Surgical Hx:  Past Surgical History:  Procedure Laterality Date  . AXILLARY LYMPH NODE DISSECTION  1974   right for melanoma followed by chemo  . BUNIONECTOMY Left 08/1984  . PORTA CATH INSERTION  08/20/2013   INTRAPERITONEAL at the time of TAH, BSO; removed 2015  . Glori Luis CATH INSERTION  05/2017   [facility]  . TOTAL ABDOMINAL HYSTERECTOMY W/ BILATERAL SALPINGOOPHORECTOMY  08/20/2013   TAH, BSO, tumor debulking, bowel resection with reanastomosis, splenectomy, omentectomy, argon beam coag, diaphragm stripping    Past Medical Hx:  Past Medical History:  Diagnosis Date  . BRCA2 gene mutation positive in female   . Diverticulosis   . Genital herpes   . Melanoma ([place])   . Osteoarthritis   . Ovarian cancer ([place])   . Thyroid nodule   . Tinnitus     Past Gynecological History:   GYNECOLOGIC HISTORY:  No LMP recorded. Menarche: 67 years old P 0   Family Hx:  Family History  Problem Relation Age of Onset  . Ovarian cancer Mother   . Breast cancer Maternal Grandmother   . Uterine cancer Maternal Aunt   . BRCA 1/2 Sister        breast cancer    Review of Systems:  Review of Systems  Neurological: Positive for numbness.  All other systems reviewed and are negative.    Vitals:  Blood pressure 137/78, pulse 92, temperature 97.8 F (36.6 C), temperature source Oral, resp. rate 20, height '5\' 4"'  (1.626 m), weight 114 lb 11.2 oz (52 kg), SpO2 100 %.  Physical Exam: ECOG PERFORMANCE STATUS: 0 - Asymptomatic   General :  Well developed, 67 y.o., female in no apparent distress HEENT:  Normocephalic/atraumatic, symmetric, EOMI, eyelids normal Neck:   Supple, no masses.   Lymphatics:  No cervical/ submandibular/ supraclavicular/ infraclavicular/ inguinal adenopathy Respiratory:  Respirations unlabored, no use of accessory muscles CV:   Deferred Breast:  Deferred Musculoskeletal: No CVA tenderness, normal muscle strength. Abdomen: Soft, non-tender and nondistended. No evidence of hernia. No masses. Extremities:  No lymphedema, no erythema, non-tender. Skin:   Normal inspection Neuro/Psych:  No focal motor deficit, no abnormal mental status. Normal gait. Normal affect. Alert and oriented to person, place, and time  Genito Urinary   Vulva:  Normal external female genitalia.   Bladder/urethra:  Urethral meatus normal in size and location. No lesions or masses, well supported bladder  Vagina: No lesion, no discharge, no bleeding.  Cervix/Uterus/Adnexa: Surgically absent  Bimanual: No masses no lesions  Rectovaginal:  No lesions, no masses.    Assessment/Plan: 1. BRCA2 predispositions o Breast screening  previously seen by Dr. Tally Due in [place].   Plan annual mammogram [facility]) and then intervening annual breast MRI (QSept)   She prefers these studies here in [place] while she is here.  Mastectomy has been discussed. She is not ready to proceed at this point. When she is ready she would rather see someone here  o Pancreatic risks  We discussed referral to GI at some point to discuss options.   For now we agreed on annual CA19-9 Loney Hering) 2. Port was placed by IR. o Flush here each visit and then in between visits at [facility]  3. Recurrent ovarian cancer o tumor profiling has been discussed on previous visits; we'll see if she progresses on PARP before revisiting o CA 125 is her MOD and will be checked monthly o PARP maintenance continues, which was started with measurable disease on imaging 4. Imaging  o last CT CAP done 03/03/18 and is scanned under "Media".  o early 2019 there was some discussion of ? PNA versus malignant process  in the lungs. Followup CXR was done and normal. Some probable residual on the chest portion of the 02/2018 CT with no new lesions o Next scan will be planned for 6 months which will be Jan 2020. (prefers [facility] closer to home) 5. PARP management o monthly BMP, hepatic panel, CA125, CBC  o refills through [facility] ([phone]) PRN 6. She asked me on prior visits about donation/research options once she passes. I have reached out to [facility] to inquire and she is referred to the anatomy department at [facility] 7. RTC Q 3 months for pelvic and monitoring of labs and port flush  Face to face time with patient was 15 minutes. Over 50% of this time was spent on counseling and coordination of care.  Isabel Caprice, MD  07/10/2018, 8:09 PM  Cc: Madeline Shave, MD ([facility]) Harlon Ditty, MD (PCP)

## 2018-07-09 ENCOUNTER — Encounter: Payer: Self-pay | Admitting: Obstetrics

## 2018-07-09 ENCOUNTER — Inpatient Hospital Stay (HOSPITAL_BASED_OUTPATIENT_CLINIC_OR_DEPARTMENT_OTHER): Payer: Medicare Other

## 2018-07-09 ENCOUNTER — Inpatient Hospital Stay: Payer: Medicare Other

## 2018-07-09 ENCOUNTER — Inpatient Hospital Stay: Payer: Medicare Other | Attending: Gynecologic Oncology | Admitting: Obstetrics

## 2018-07-09 VITALS — BP 137/78 | HR 92 | Temp 97.8°F | Resp 20 | Ht 64.0 in | Wt 114.7 lb

## 2018-07-09 DIAGNOSIS — Z90710 Acquired absence of both cervix and uterus: Secondary | ICD-10-CM | POA: Insufficient documentation

## 2018-07-09 DIAGNOSIS — R1909 Other intra-abdominal and pelvic swelling, mass and lump: Secondary | ICD-10-CM

## 2018-07-09 DIAGNOSIS — Z95828 Presence of other vascular implants and grafts: Secondary | ICD-10-CM

## 2018-07-09 DIAGNOSIS — Z9221 Personal history of antineoplastic chemotherapy: Secondary | ICD-10-CM | POA: Diagnosis not present

## 2018-07-09 DIAGNOSIS — C569 Malignant neoplasm of unspecified ovary: Secondary | ICD-10-CM | POA: Insufficient documentation

## 2018-07-09 DIAGNOSIS — Z1239 Encounter for other screening for malignant neoplasm of breast: Secondary | ICD-10-CM

## 2018-07-09 DIAGNOSIS — Z90722 Acquired absence of ovaries, bilateral: Secondary | ICD-10-CM | POA: Diagnosis not present

## 2018-07-09 DIAGNOSIS — Z79899 Other long term (current) drug therapy: Secondary | ICD-10-CM

## 2018-07-09 DIAGNOSIS — C801 Malignant (primary) neoplasm, unspecified: Principal | ICD-10-CM

## 2018-07-09 DIAGNOSIS — Z1231 Encounter for screening mammogram for malignant neoplasm of breast: Secondary | ICD-10-CM

## 2018-07-09 LAB — CBC WITH DIFFERENTIAL (CANCER CENTER ONLY)
Abs Immature Granulocytes: 0 10*3/uL (ref 0.00–0.07)
Basophils Absolute: 0.1 10*3/uL (ref 0.0–0.1)
Basophils Relative: 1 %
EOS PCT: 2 %
Eosinophils Absolute: 0.1 10*3/uL (ref 0.0–0.5)
HCT: 36.3 % (ref 36.0–46.0)
Hemoglobin: 12 g/dL (ref 12.0–15.0)
Immature Granulocytes: 0 %
Lymphocytes Relative: 29 %
Lymphs Abs: 1.4 10*3/uL (ref 0.7–4.0)
MCH: 29.9 pg (ref 26.0–34.0)
MCHC: 33.1 g/dL (ref 30.0–36.0)
MCV: 90.5 fL (ref 80.0–100.0)
MONO ABS: 0.7 10*3/uL (ref 0.1–1.0)
MONOS PCT: 14 %
Neutro Abs: 2.6 10*3/uL (ref 1.7–7.7)
Neutrophils Relative %: 54 %
Platelet Count: 250 10*3/uL (ref 150–400)
RBC: 4.01 MIL/uL (ref 3.87–5.11)
RDW: 14.6 % (ref 11.5–15.5)
WBC: 4.7 10*3/uL (ref 4.0–10.5)
nRBC: 0.6 % — ABNORMAL HIGH (ref 0.0–0.2)

## 2018-07-09 LAB — BASIC METABOLIC PANEL
Anion gap: 12 (ref 5–15)
BUN: 14 mg/dL (ref 8–23)
CALCIUM: 8.9 mg/dL (ref 8.9–10.3)
CO2: 25 mmol/L (ref 22–32)
CREATININE: 0.89 mg/dL (ref 0.44–1.00)
Chloride: 104 mmol/L (ref 98–111)
GFR calc Af Amer: >60 mL/min (ref >60)
GLUCOSE: 90 mg/dL (ref 70–99)
Potassium: 4.2 mmol/L (ref 3.5–5.1)
SODIUM: 141 mmol/L (ref 135–145)

## 2018-07-09 LAB — HEPATIC FUNCTION PANEL
ALK PHOS: 69 U/L (ref 38–126)
ALT: 21 U/L (ref 0–44)
AST: 32 U/L (ref 15–41)
Albumin: 4.3 g/dL (ref 3.5–5.0)
BILIRUBIN DIRECT: 0.1 mg/dL (ref 0.0–0.2)
BILIRUBIN INDIRECT: 0.4 mg/dL (ref 0.3–0.9)
BILIRUBIN TOTAL: 0.5 mg/dL (ref 0.3–1.2)
TOTAL PROTEIN: 7.1 g/dL (ref 6.5–8.1)

## 2018-07-09 MED ORDER — ALTEPLASE 2 MG IJ SOLR
2.0000 mg | Freq: Once | INTRAMUSCULAR | Status: DC | PRN
  Filled 2018-07-09: qty 2

## 2018-07-09 MED ORDER — SODIUM CHLORIDE 0.9% FLUSH
10.0000 mL | Freq: Once | INTRAVENOUS | Status: AC
  Administered 2018-07-09: 10 mL
  Filled 2018-07-09: qty 10

## 2018-07-09 MED ORDER — SODIUM CHLORIDE 0.9% FLUSH
10.0000 mL | INTRAVENOUS | Status: DC | PRN
  Filled 2018-07-09: qty 10

## 2018-07-09 MED ORDER — HEPARIN SOD (PORK) LOCK FLUSH 100 UNIT/ML IV SOLN
500.0000 [IU] | Freq: Once | INTRAVENOUS | Status: AC
  Administered 2018-07-09: 500 [IU]
  Filled 2018-07-09: qty 5

## 2018-07-09 NOTE — Patient Instructions (Signed)
1. Return in 3 months for pelvic and lab review 2. Mammogram on return 3. CTScan closer to home in January 4. Port flush in 6 weeks in [place] and then on return here

## 2018-07-10 ENCOUNTER — Encounter: Payer: Self-pay | Admitting: Obstetrics

## 2018-07-11 ENCOUNTER — Telehealth: Payer: Self-pay | Admitting: *Deleted

## 2018-07-11 LAB — CA 125: CANCER ANTIGEN (CA) 125: 11.7 U/mL (ref 0.0–38.1)

## 2018-07-11 NOTE — Telephone Encounter (Signed)
Per Dr. Gerarda Fraction, faxed orders to the following and received confirmation:  1. [facility]--labs orders to [phone]  2. [facility]-- port flush order to [phone]  3. [facility]--CT scan orders to [phone]  4. [facility]--mammogram order to [phone]

## 2018-07-14 ENCOUNTER — Telehealth: Payer: Self-pay

## 2018-07-14 NOTE — Telephone Encounter (Signed)
Told Madeline Perry that her labs were stable on 07-09-18. CA-125 was 11.7. Continue on the Niraparib as prescribed. Repeat labs as Scheduled at [facility]  As ordered on AVS per Dr. Gerarda Fraction.

## 2018-08-08 ENCOUNTER — Encounter: Payer: Self-pay | Admitting: Obstetrics

## 2018-08-08 ENCOUNTER — Telehealth: Payer: Self-pay

## 2018-08-08 NOTE — Telephone Encounter (Signed)
Reviewed results with Madeline Perry.   Hgb was good at 12.0, CA-125 was good at 9.0.. Last check was 11.7 at [facility].(different lab) Value WNL. Her AST was slightly elevated at 40. Normal range is 14-36.  Madeline John, NP said to continue the [facility] as prescribed.  Dr. Gerarda Fraction will review the labs next week when she returns to the office.  We will call her if Dr. Gerarda Fraction recommends and changes in medication dosing or lab scheduling. Pt verbalized understanding.

## 2018-08-14 ENCOUNTER — Other Ambulatory Visit: Payer: Self-pay | Admitting: Gynecologic Oncology

## 2018-08-14 DIAGNOSIS — C569 Malignant neoplasm of unspecified ovary: Secondary | ICD-10-CM

## 2018-09-02 ENCOUNTER — Telehealth: Payer: Self-pay | Admitting: Oncology

## 2018-09-02 NOTE — Telephone Encounter (Signed)
Safaa called back and said she was aware of the mm appointment on 10/29/18.  She is going to call and try to move up the appointment to before Dr. Gerarda Fraction follow up on 10/10/18.  Also discussed that [facility] will be faxing a prior authorization for Niraparib.

## 2018-09-02 NOTE — Telephone Encounter (Signed)
[facility] and patient has an appointment for a mammogram on October 29, 2018. Also left a message for Kynzi to make sure she is aware of appointment.

## 2018-09-03 ENCOUNTER — Telehealth: Payer: Self-pay | Admitting: Oncology

## 2018-09-03 NOTE — Telephone Encounter (Signed)
Madeline Perry called and said she is keeping her mammogram on 10/29/18 due to insurance.  She also asked if she can move her apt with Dr. Gerarda Fraction on 2/28 to 3/2 because she has another obligation.    She also said she call [employer] again and there is a website - www.covermymeds.com/epa/bcbs to submit a prior authorization for Zejula.  Discussed that I will try to contact pharmacy to see how this should be completed.

## 2018-09-04 ENCOUNTER — Telehealth: Payer: Self-pay | Admitting: Obstetrics

## 2018-09-04 ENCOUNTER — Telehealth: Payer: Self-pay | Admitting: Oncology

## 2018-09-04 ENCOUNTER — Telehealth: Payer: Self-pay | Admitting: Pharmacist

## 2018-09-04 NOTE — Telephone Encounter (Signed)
Patient aware of ct results by me 09/04/18

## 2018-09-04 NOTE — Telephone Encounter (Signed)
Oral Oncology Pharmacist Encounter  Received notification from Joylene John, NP that patient was in need of insurance authorization for her Zejula (niraparib) for the mainitenance treatment of recurrent ovarian cancer, BRCA2 positive, planned duration until disease progression or unacceptable toxicity. Original Zejula start date: 05/02/2018 Noted dose reductions during course due to thrombocytopenia and anemia Patient now continues on 132m QoD alternating with 2026mQoD for an average daily dose of 15028mLabs from 07/09/18 assessed, OK for treatment.  Current medication list in Epic reviewed, no DDIs with Zejula identified.  [facility] [place] [place] on Cover My Meds Key: [id number] Request was cancelled by [employer].  I called [facility] at [phone] gain additional information as it appeared insurance authorization had already been approved. [facility] stated they had reached out to patient on 08/29/2018 to schedule next delivery of Zejula and quoted copayment ~$2500, as the insurance calendar year has reset, and copayments are generally this high at this time of the year as patients are still in the initial coverage. Notes in [facility] system stated the patient told pharmacy she would think about it and call them back. They have spoken to patient again on 09/03/2018 PM, where patient again said she was not sure if she would pay for the dispense. There are currently copayment funds open and available for patient's diagnosis to help cover the out of pocket costs for her Zejula.  I called and left patient a message, stating that I have additional information about her medication. I will offer to obtain copayment grant funds on patient's behalf and coordinate further medication acquisition once we are able to speak.  JesJohny DrillingharmD, BCPS, BCOP  09/04/2018  7:55 AM Oral Oncology Clinic [phone]

## 2018-09-04 NOTE — Telephone Encounter (Signed)
Called patient per 1/22 sch message - pt is aware.

## 2018-09-04 NOTE — Telephone Encounter (Signed)
Left a message for Madeline Perry regarding her appointment changes on 10/13/18.

## 2018-09-09 NOTE — Telephone Encounter (Signed)
Oral Oncology Pharmacist Encounter  I called patient today to follow-up on Zejula prescription acquisition. Patient states that she is receiving her next fill of [facility] tomorrow, 09/10/2018. She states her copayment for this fill of Zejula was $2700. She anticipates a reduction in her copayment to approximately one quarter of that for the remaining fills of this calendar year. Patient's husband went back to work full-time this year and their income is too high to qualify for any open copayment foundation grants that are currently available. Patient informed that we do have oral oncology clinic services here at [facility] that can assist with medication acquisition, copayment assistance, and manufacturer application assistance if that is ever needed in the future. All questions answered. The patient knows to call the office with any additional questions or concerns.  Johny Drilling, PharmD, BCPS, BCOP  09/09/2018 12:50 PM Oral Oncology Clinic [phone]

## 2018-09-10 ENCOUNTER — Telehealth: Payer: Self-pay | Admitting: Oncology

## 2018-09-10 ENCOUNTER — Telehealth: Payer: Self-pay | Admitting: *Deleted

## 2018-09-10 NOTE — Telephone Encounter (Signed)
[facility] and requested patient's lab results.  They said they were drawn today and the CA 125 was sent out.  They will fax what they have resulted to [phone].

## 2018-09-10 NOTE — Telephone Encounter (Signed)
Called and spoke with the patient, moved her appt from 3/2 to 2/12. Per Dr. Gerarda Fraction I have canceled the labs and flush.

## 2018-09-12 ENCOUNTER — Telehealth: Payer: Self-pay | Admitting: Oncology

## 2018-09-12 NOTE — Telephone Encounter (Signed)
Called Madeline Perry and advised her of CA 125 result of 8.9 and to continue taking Zejula per Dr. Gerarda Fraction.  She verbalized understanding and agreement.

## 2018-09-19 ENCOUNTER — Other Ambulatory Visit: Payer: Self-pay | Admitting: Obstetrics

## 2018-09-19 DIAGNOSIS — C569 Malignant neoplasm of unspecified ovary: Secondary | ICD-10-CM

## 2018-09-22 ENCOUNTER — Telehealth: Payer: Self-pay | Admitting: *Deleted

## 2018-09-22 NOTE — Telephone Encounter (Addendum)
Faxed referral and records to Dr. Rhodia Albright office in [place]. Called and left the patient message to call the office.

## 2018-09-24 ENCOUNTER — Inpatient Hospital Stay: Payer: Medicare Other | Attending: Gynecologic Oncology | Admitting: Obstetrics

## 2018-09-24 ENCOUNTER — Other Ambulatory Visit: Payer: Self-pay | Admitting: Gynecologic Oncology

## 2018-09-24 ENCOUNTER — Encounter: Payer: Self-pay | Admitting: Obstetrics

## 2018-09-24 VITALS — BP 143/81 | HR 87 | Temp 98.2°F | Resp 20 | Ht 64.0 in | Wt 116.4 lb

## 2018-09-24 DIAGNOSIS — Z90722 Acquired absence of ovaries, bilateral: Secondary | ICD-10-CM | POA: Diagnosis not present

## 2018-09-24 DIAGNOSIS — C569 Malignant neoplasm of unspecified ovary: Secondary | ICD-10-CM

## 2018-09-24 DIAGNOSIS — Z9221 Personal history of antineoplastic chemotherapy: Secondary | ICD-10-CM | POA: Insufficient documentation

## 2018-09-24 DIAGNOSIS — Z90710 Acquired absence of both cervix and uterus: Secondary | ICD-10-CM | POA: Diagnosis not present

## 2018-09-24 DIAGNOSIS — Z79899 Other long term (current) drug therapy: Secondary | ICD-10-CM | POA: Insufficient documentation

## 2018-09-24 MED ORDER — NIRAPARIB TOSYLATE 100 MG PO CAPS: 100.0000 mg | ORAL_CAPSULE | ORAL | 3 refills | Status: AC

## 2018-09-24 NOTE — Progress Notes (Signed)
Progress Note : Established Patient FOLLOW-UP  CC:  Chief Complaint  Patient presents with  . Malignant neoplasm of ovary, unspecified laterality [facility])   GYN Oncologic Summary: 1. Stage 3C (vs 4) Ovarian Cancer  - s/p Debulking Jan 2015 at [place] (Dr. Gifford Perry) ELAP/TAH/BSO/Splenectomy/ TColon rsxn with RA 08/20/13   - splenic pathology with "superficial parenchymal disease"  - s/p 6 cycles IV/IP Taxol/Cis/Taxol 01/06/18 2. Recurrent disease Feb 2018 by rising CA125 and imaging  - Carbo/Taxol Q3 week 11/30/17-03/14/17 (partial response)  - Niraparib 05/02/17 - present   Start 328m with thrombocytopenia requiring dose reduction   Decrease to 2082m10/2018 with anemia   Decreased to 100 mg QOD /200 mg QOD 12/16/2017 3. 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 JaCayden Rautiois a very nice 89.o.  year old P0  Interval History: Recall she was treated for ovarian cancer originally by Dr. Ma[facility]ifford [facility]avet [facility] [employer] [place] She returns today to follow-up on PARP maintenance.  She continues on the regimen which gives her essentially a 15068maily on average dosing. She started this new dosing regimen 12/16/17 and labs since have been stable. She has some elevation in her AST that has been <1.5 x ULN. [facility] bit different than [facility]), but [facility] it has been normal.  [facility] and shows an unchanged 6mm74mw attenuation lesion in the right hepatic lobe compared to "multiple prior exams" including 02/2018.   She is doing well. No complaints.  Other screening studies MMG 10/23/17 [facility] 03/2018 negative CA19-9 was normal here 10/23/17 at 26  General Issues on prior visits:  (stable) -- Neuropathy was notable with her second line platinum Taxol therapy.  No complaints by the end of  chemotherapy treatment  (per PCP) Thyroid nodule-PCP managing.  Biopsy has been done and reportedly benign  (resolved) Vaginal spotting April 11, 2017-described as pink and watery staining underwear to times not enough to leak through.  Denies increased activity surrounding this episode.  Felt to be atrophy based on my exam at that time.  (improved after changing PARP to am) having issues at home and having trouble sleeping.   (stable - improved) Neck radiating pain - CSpine xray was normal 11/06/17; tolerating by trying to avoid movement that incites the pain  (sister doing well) - sister diagnosed summer 2019 with breast cancer  PARP Issues: . Thrombocytopenia   3rd week after standard dosing of 300 with platelets down to 20 K.  We held her treatment May 22, 2017.  Repeat platelet May 28, 2017 was 9K and she was transfused.  She was then dose reduced down to 200  . Anemia  anemia which required transfusion in late 2018 while she was on 200 daily  cycle 3 observed to 8.9.  Iron studies were ordered and normal.    08/27/2017 she did have anemia significant enough to require transfusion she was held and restarted at 200 daily  Still anemic 10/2017 and so we [place] 100 QOD.  H/H since has been normal including 09/10/18 labs.   Measurement of disease:  CA 125 and imaging Ca125 08/18/2013 preop > 4000 06/2015 = 11.9 03/2016 = 16___________________ Recurrence (  2.5 years disease free) 09/2016 = 45 recheck 63 11/08/2016 = 292 12/21/2016 = 91 (cycle 2) 01/09/2017 = 17.1 (cycle 3) 03/14/2017 = 14 (cycle 6)   end of treatment_____ Niraparib start 05/02/2017 05/08/2017 = 11.2 (C1) 05/28/2017 = 9 (C2) 06/27/2017 = 13 (C3) 07/2017 = ______(C4) 08/14/2017 = 10 (C5) CHANGE Facility of care 10/2017 = 12.4 (C7) 11/2017 = 6.7 (C8) 12/2017 = missed due to misunderstanding with lab draws(C9) 01/2018 = 11.4 (C10) 03/10/2018  = 10.7  03/2018 = 11.4   --- ONE YEAR PARP--- 05/15/18 = 8.3 06/09/18 = 9.2 07/09/18 = 11.7 08/08/18 = 9 09/10/18 = 8.9  Recent Labs    10/23/17 0937 01/13/18 1042 04/04/18 1146 07/09/18 1201  CAN125 12.4 11.4 11.4 11.7     Oncology History   She presented in December 2014 to her PCP with pelvic pain and imaging was performed.  A pelvic mass was found and she made her way to Dr. Isaiah Perry.  She then underwent IV/IP Taxol/cis/Taxol for 6 cycles.  Her Ca125 preop was >4000.  It looks to have fallen nicely during treatment and was 11.9 in November 2016 1 year after her treatment ended.  She was found to have a BRCA2 mutation, documented now in the chart, from myriad my risk testing March 2015.  Recurrence February 2018.  Received 6 cycles of IV carboplatin Taxol. Issues with second line chemotherapy included neuropathy and petechiae, now resolved, in lower extremities, but no thrombocytopenia.  PARP maintenance 05/02/2017. Experienced thrombocytopenia and anemia.      Malignant neoplasm of ovary ([place])   10/2013 Genetic Testing    Patient has genetic testing done for her ovarian cancer diagnosis. 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 07/31/2013-UTS W-peritoneal carcinomatosis omental caking large mass in the pelvis liver and spleen do not have any focal or diffuse parenchymal abnormalities.  Calcification in the right lung base probable granuloma.  Ascites. . 11/08/2016-RT a-a heterogeneous nodule from the posterior right thyroid measuring 1.7 x 1.3.  Multiple metastatic lesions in the liver including a segment 3 lesion 3.3 x 2.9 cm and a segment 7 lesion measuring 3.2 x 3 cm.  In the pelvis there is a soft tissue mass more superiorly measuring 2.4 x 1.6 cm and then inferior to this measuring 2.8 x 2.3 cm.  Addendum compared to prior  there are no significant differences in the overall interpretation.  Note that the thyroid lesion was not present on the prior. . 12/21/2016-thyroid ultrasound right-sided thyroid nodules recommend 3-82-monthfollow-up. . 02/11/2017-CT CAP-[facility]-hepatic metastases have decreased in size or resolved compared to March 2018 index lesions remaining today include 14 mm right hepatic lobe; 11 x 11 mm right hepatic lobe abutting the caudate lobe; 7 mm anterior right hepatic lobe; 16 mm lateral segment left hepatic lobe.  Left pelvic mass no longer seen. . 02/11/2017-PET-[facility]-liver demonstrates areas of diminished FDG avidity consistent with cystlike changes no distinct FDG avid abnormalities noted; longitudinally oriented increased activity along the lateral aspect of the sigmoid colon at the mid pelvis SUV 3.11. . 08/26/2017-RTA-CT CAP-large calcified granuloma right middle lobe.  Focal area of masslike consolidation right middle lobe which is new from prior study 2.3 cm pneumonia versus malignancy.  Degenerative changes in the spine with convex scoliosis at the thoracic lumbar junction.  Diffuse fatty infiltration  of the liver has decreased.  Low-attenuation mass posterior superior segment right lobe of the liver measures 0.9 cm versus 1.8 cm February 11, 2017.  Subtle low-attenuation lesion in the posterior superior segment of the right lobe of the liver measuring 0.6 cm on current exam versus 1.1 cm 02/11/2017.  Low-attenuation lesion in the left lobe of the liver which measures 0.7 cm versus 1.6 cm July 2018.  No new or progressive liver lesions seen.  Diverticulosis.  Mild circumferential wall thickening involving the sigmoid colon left pelvis which does correlate with an area of uptake on a PET July 2018.  No peritoneal based soft tissue mass demonstrated.  No adnexal mass.  02/2018 residual 7x68m nodule where consolidation in RML seen lung prior. Residual nodule is no changed. Stabele calcified RLL granuloma.  7316mhypodensity previously 16m36mcompared to Jan 2019). Other liver hypodenisties seen on prior study not seen now. No new liver lesions. Sigmoid coon thickening resolved. + colonic diverticuli.        Tumor Marker    Ca125 08/18/2013 preop > 4000       Current Meds:  Outpatient Encounter Medications as of 09/24/2018  Medication Sig  . Ascorbic Acid (VITAMIN C PO) Take by mouth.  . GMarland KitchenUTAMINE PO Take by mouth daily.   . Pyridoxine HCl (VITAMIN B-6 PO) Take by mouth.  . vitamin B-12 (CYANOCOBALAMIN) 1000 MCG tablet Take 1,000 mcg by mouth daily.  . NAlanda Slimsylate 100 MG CAPS Take 100 mg by mouth as directed. Alternate between 100 mg daily then 200 mg on alternate days x 1 mth With or without food at the same time daily. May take at bedtime to manage nausea  . [DISCONTINUED] Niraparib Tosylate 100 MG CAPS Take 100 mg by mouth as directed. Alternate between 100 mg daily then 200 mg on alternate days x 1 mth With or without food at the same time daily. May take at bedtime to manage nausea (Patient not taking: Reported on 09/24/2018)   No facility-administered encounter medications on file as of 09/24/2018.     Allergy: No Known Allergies  Social Hx:   Social History   Socioeconomic History  . Marital status: Married    Spouse name: Not on file  . Number of children: Not on file  . Years of education: Not on file  . Highest education level: Not on file  Occupational History  . Not on file  Social Needs  . Financial resource strain: Not on file  . Food insecurity:    Worry: Not on file    Inability: Not on file  . Transportation needs:    Medical: Not on file    Non-medical: Not on file  Tobacco Use  . Smoking status: Never Smoker  . Smokeless tobacco: Never Used  Substance and Sexual Activity  . Alcohol use: Yes    Comment: occasional  . Drug use: No  . Sexual activity: Yes  Lifestyle  . Physical activity:    Days per week: Not on file    Minutes per session: Not on  file  . Stress: Not on file  Relationships  . Social connections:    Talks on phone: Not on file    Gets together: Not on file    Attends religious service: Not on file    Active member of club or organization: Not on file    Attends meetings of clubs or organizations: Not on file    Relationship status: Not on file  . Intimate  partner violence:    Fear of current or ex partner: Not on file    Emotionally abused: Not on file    Physically abused: Not on file    Forced sexual activity: Not on file  Other Topics Concern  . Not on file  Social History Narrative  . Not on file    Past Surgical Hx:  Past Surgical History:  Procedure Laterality Date  . AXILLARY LYMPH NODE DISSECTION  1974   right for melanoma followed by chemo  . BUNIONECTOMY Left 08/1984  . PORTA CATH INSERTION  08/20/2013   INTRAPERITONEAL at the time of TAH, BSO; removed 2015  . Glori Luis CATH INSERTION  05/2017   [facility]  . TOTAL ABDOMINAL HYSTERECTOMY W/ BILATERAL SALPINGOOPHORECTOMY  08/20/2013   TAH, BSO, tumor debulking, bowel resection with reanastomosis, splenectomy, omentectomy, argon beam coag, diaphragm stripping    Past Medical Hx:  Past Medical History:  Diagnosis Date  . BRCA2 gene mutation positive in female   . Diverticulosis   . Genital herpes   . Melanoma ([place])   . Osteoarthritis   . Ovarian cancer ([place])   . Thyroid nodule   . Tinnitus     Past Gynecological History:   GYNECOLOGIC HISTORY:  No LMP recorded. Menarche: 68 years old P 0   Family Hx:  Family History  Problem Relation Age of Onset  . Ovarian cancer Mother   . Breast cancer Maternal Grandmother   . Uterine cancer Maternal Aunt   . BRCA 1/2 Sister        breast cancer    Review of Systems:  Review of Systems  Neurological: Positive for numbness.  All other systems reviewed and are negative.    Vitals:  Blood pressure (!) 143/81, pulse 87, temperature 98.2 F (36.8 C), temperature source  Oral, resp. rate 20, height '5\' 4"'  (1.626 m), weight 116 lb 6.4 oz (52.8 kg), SpO2 100 %.  Physical Exam: ECOG PERFORMANCE STATUS: 0 - Asymptomatic   General :  Well developed, 68 y.o., female in no apparent distress HEENT:  Normocephalic/atraumatic, symmetric, EOMI, eyelids normal Neck:   Supple, no masses.  Lymphatics:  No cervical/ submandibular/ supraclavicular/ infraclavicular/ inguinal adenopathy Respiratory:  Respirations unlabored, no use of accessory muscles CV:   Deferred Breast:  Deferred Musculoskeletal: No CVA tenderness, normal muscle strength. Abdomen: Soft, non-tender and nondistended. No evidence of hernia. No masses. Extremities:  No lymphedema, no erythema, non-tender. Skin:   Normal inspection Neuro/Psych:  No focal motor deficit, no abnormal mental status. Normal gait. Normal affect. Alert and oriented to person, place, and time  Genito Urinary   Vulva:  Normal external female genitalia.   Bladder/urethra:  Urethral meatus normal in size and location. No lesions or masses, well supported bladder  Vagina: No lesion, no discharge, no bleeding.  Cervix/Uterus/Adnexa: Surgically absent  Bimanual: No masses no lesions  Rectovaginal:  No lesions, no masses.    Assessment/Plan: 1. BRCA2 predispositions o Breast screening  previously seen by Dr. Tally Due in [place].   Plan annual mammogram [facility]) and then intervening annual breast MRI (QSept)   She prefers these studies here in [place] while she is here however may change to [facility] where she has had CT in past.  Mastectomy has been discussed. She is not ready to proceed at this point.  o Pancreatic risks  We discussed referral to GI at some point to discuss options.   For now we agreed on annual CA19-9 Loney Hering) 2. Port was  placed by IR. o Flush here each visit and then in between visits at [facility]  3. Recurrent ovarian cancer o tumor profiling has been discussed on previous visits;  we'll see if she progresses on PARP before revisiting o CA 125 is her MOD and will be checked monthly o PARP maintenance continues, which was started with measurable disease on imaging 4. Imaging o last CT CAP done 08/2018 and is scanned under "Media".  o early 2019 there was some discussion of ? PNA versus malignant process in the lungs. Followup CXR was done and normal. Some probable residual on the chest portion of the 02/2018 CT with no new lesions o Next scan would be planned for 6 months which will be July 2020 (prefers [place] closer to home) 5. PARP management o monthly BMP, hepatic panel, CA125, CBC  o We have given her orders for labs at home Feb/March/April/May o refills through [facility] [phone]) PRN o I ordered Rx today with 3 refills. 6. She is wanting follow-up in [place], closer to home and we have sent referral paperwork. If she has not heard by early next week about date for visit she will call Sharyn Lull and we will see if we can expedite. I have told her we don't want to wait for the next 3 month visit to set this up as she may have Rx needs, imaging needs to followup on, etc.  Face to face time with patient was 15 minutes. Over 50% of this time was spent on counseling and coordination of care.  Isabel Caprice, MD  09/24/2018, 1:34 PM  Cc: Gifford Shave, MD ([facility]) Harlon Ditty, MD (PCP)

## 2018-09-24 NOTE — Patient Instructions (Signed)
1. [place] office [facility]) should be calling you to schedule appointment. If no followup by 09/29/18 please call and talk to Sharyn Lull so we can reach out to them. The goal is to have you seen there the early part of March at the latest. 2. Labs changed to Feb 27 and we will let you know the results 3. If any challenges with [facility] refills let us know by the last week of February

## 2018-09-24 NOTE — Progress Notes (Signed)
Labs to be drawn at [facility] on 10/09/18

## 2018-09-26 ENCOUNTER — Telehealth: Payer: Self-pay | Admitting: *Deleted

## 2018-09-26 NOTE — Telephone Encounter (Signed)
Fax lab orders to [facility]

## 2018-10-08 ENCOUNTER — Telehealth: Payer: Self-pay | Admitting: Oncology

## 2018-10-08 NOTE — Telephone Encounter (Signed)
Madeline Perry and asked when her lab appointment is.  She said she has an appointment at 9:30 tomorrow.

## 2018-10-09 ENCOUNTER — Encounter: Payer: Self-pay | Admitting: Obstetrics

## 2018-10-10 ENCOUNTER — Other Ambulatory Visit: Payer: Medicare Other

## 2018-10-10 ENCOUNTER — Ambulatory Visit: Payer: Medicare Other | Admitting: Obstetrics

## 2018-10-10 NOTE — Telephone Encounter (Signed)
[facility] again and they don't have a record of labs from yesterday.  Called Chellie back and advised her to have Dr. Asa Saunas office try calling on Monday.  She verbalized agreement.

## 2018-10-10 NOTE — Telephone Encounter (Signed)
Labs received from [facility] back and advised her that the labs look good per Joylene John, NP and that we will fax them to Dr. Asa Saunas office per her request.

## 2018-10-10 NOTE — Telephone Encounter (Signed)
[facility] to check on status of labs.  They do not have any records of labs from yesterday.  Madeline Perry and she said the labs were done with her port flush in surgery at [facility].  She tried calling them but their office is closed today.  She also said she will be seeing Dr. Rhodia Albright on Monday and to fax the labs if we get them to [phone] to attn: [name].

## 2018-10-13 ENCOUNTER — Other Ambulatory Visit: Payer: Medicare Other

## 2018-10-13 ENCOUNTER — Ambulatory Visit: Payer: Medicare Other | Admitting: Obstetrics

## 2019-04-29 IMAGING — MR MR BILATERAL BREAST WITHOUT AND WITH CONTRAST
6 of 13 series · 20 of 48 positions shown · IV contrast (multihance)
Comparison: Previous exam(s).

CLINICAL DATA: 66-year-old female presenting for high-risk
screening. The patient is BRCA positive with a history of ovarian
cancer.

LABS:  BUN: 17
Creatinine:
GFR: > 60
EXAM:
BILATERAL BREAST MRI WITH AND WITHOUT CONTRAST
TECHNIQUE: Multiplanar, multisequence MR images of both breasts were obtained
prior to and following the intravenous administration of 10 ml of
MultiHance.
Three-dimensional MR images were rendered by post-processing the
original MR data using the DynaCAD thin client. The 3D MR images are
interpreted and the findings are included in the complete MRI report
below.

[Series 1: (phone_number) · axial · 7.0mm · 1.56mm/px · 1 of 25 slices shown]
[im 1/25]
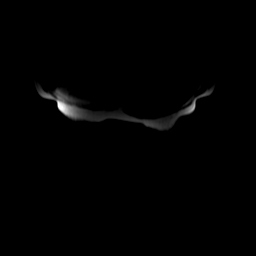

[Series 4: ax ir · axial · 3.0mm · 0.62mm/px · 1 of 75 slices shown]
[im 1/75]
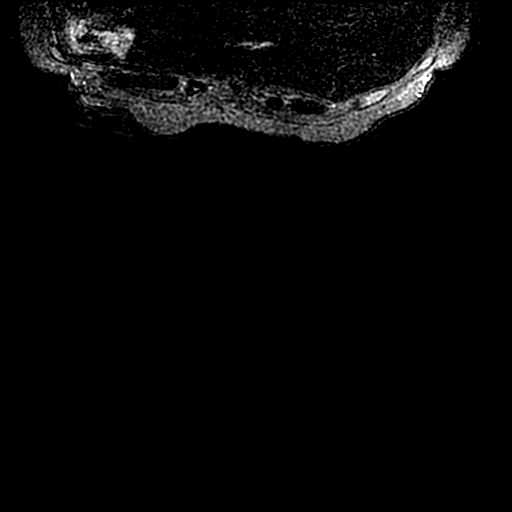

[Series 500: vibrant mph +c · axial · 1.8mm · 0.62mm/px · z∈[-103,+119]mm · 5 of 248 slices shown]
[im 1/248]
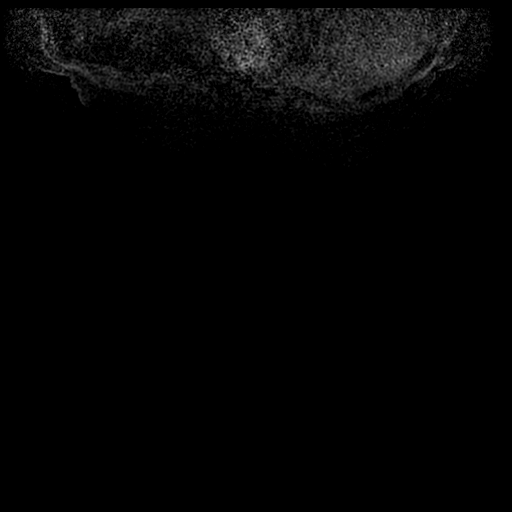
[im 62/248]
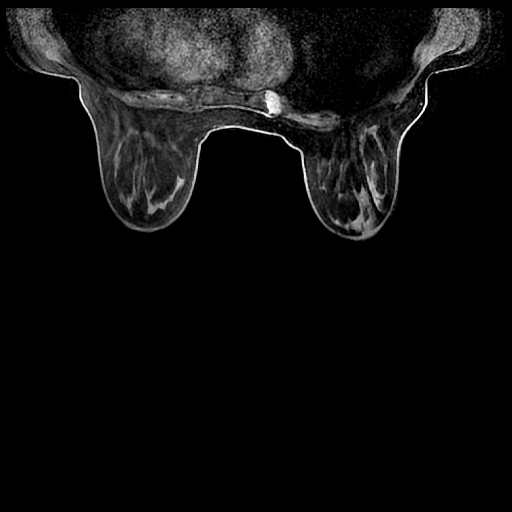
[im 124/248]
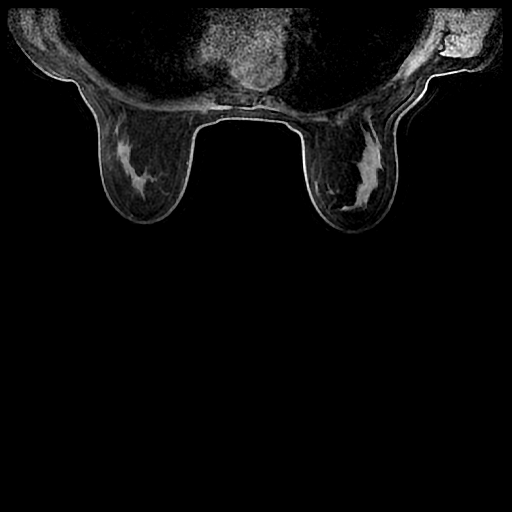
[im 186/248]
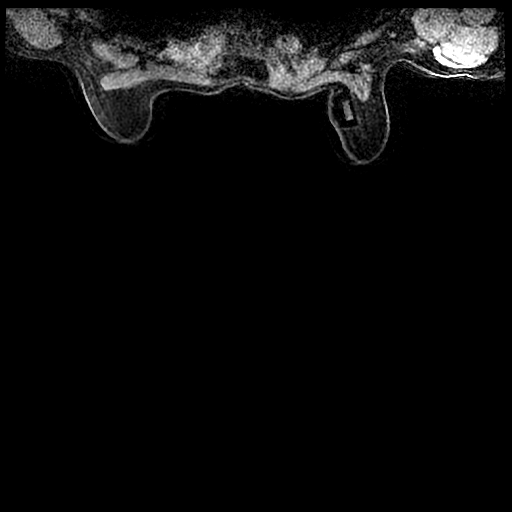
[im 248/248]
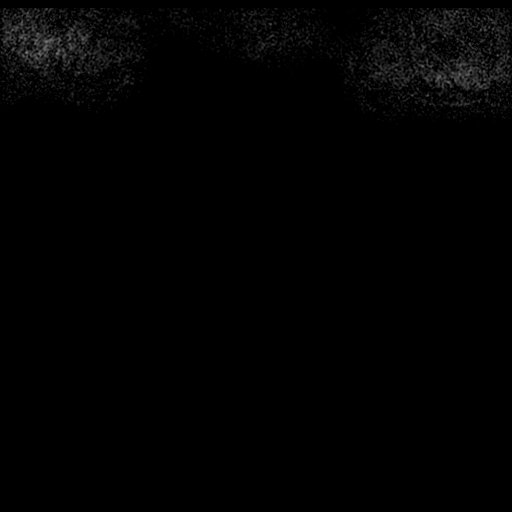

[Series 501: ph1/ vibrant mph · axial · 1.8mm · 0.62mm/px · z∈[-103,+119]mm · 6 of 248 slices shown]
[im 1/248]
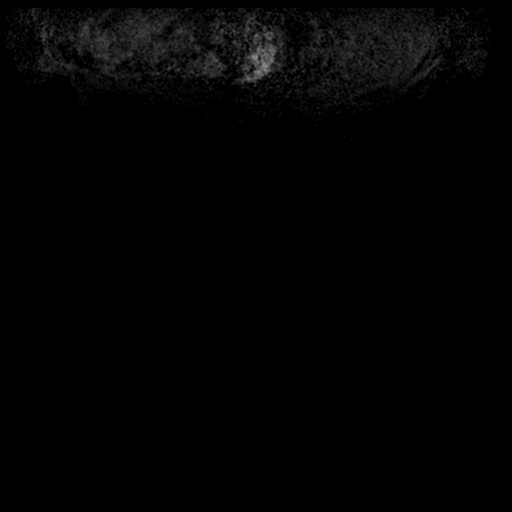
[im 50/248]
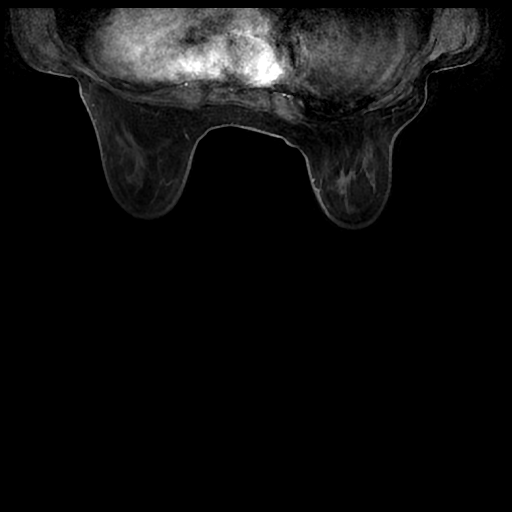
[im 99/248]
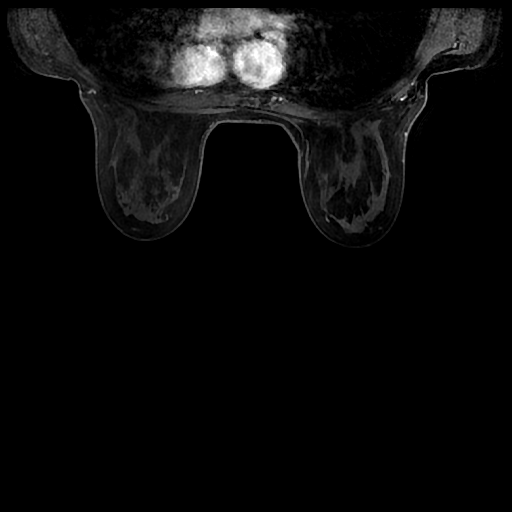
[im 149/248]
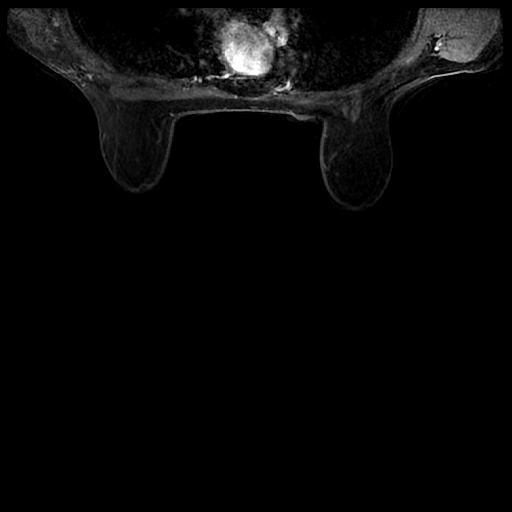
[im 198/248]
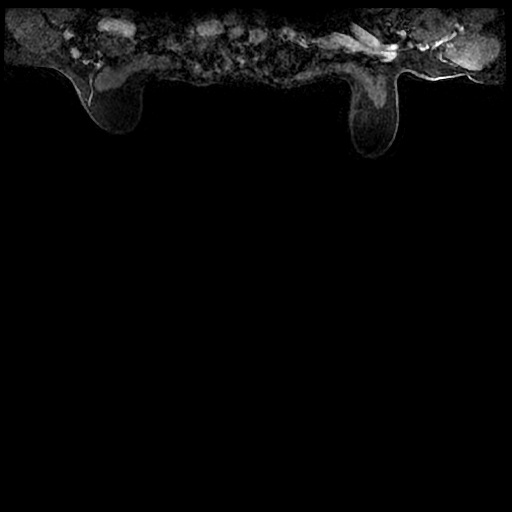
[im 248/248]
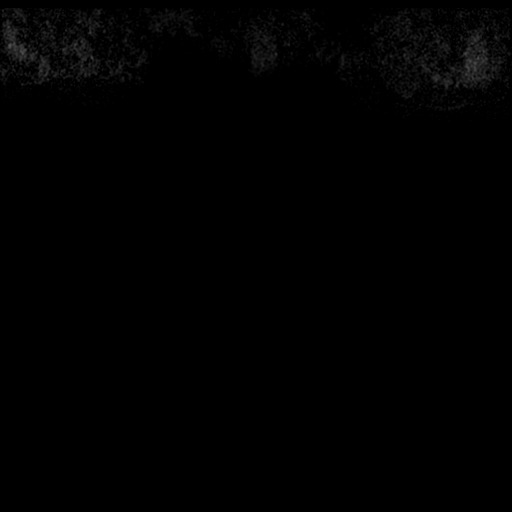

[Series 502: ph2/ vibrant mph · axial · 1.8mm · 0.62mm/px · z∈[-103,+119]mm · 6 of 248 slices shown]
[im 1/248]
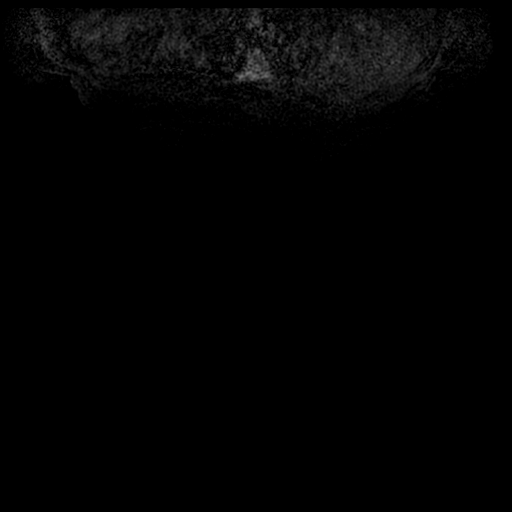
[im 50/248]
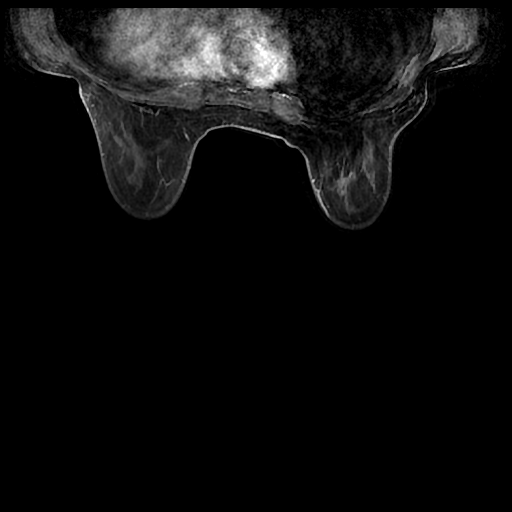
[im 99/248]
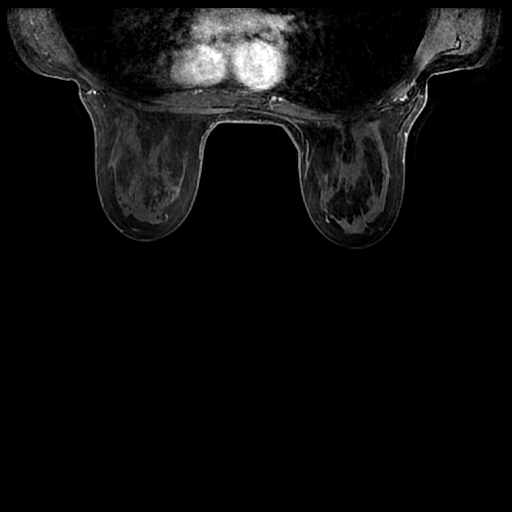
[im 149/248]
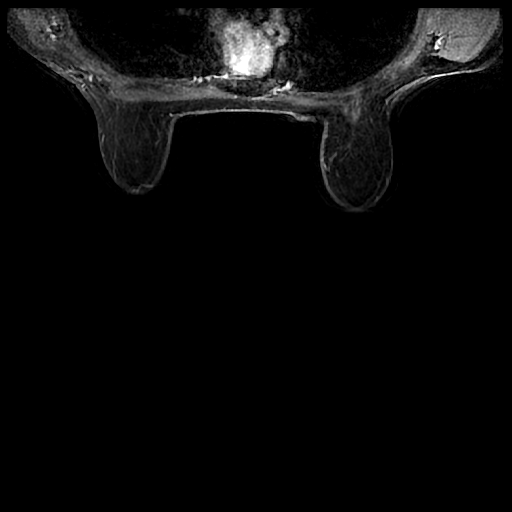
[im 198/248]
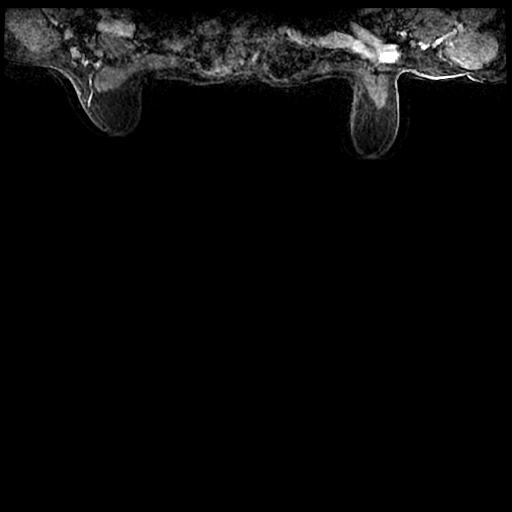
[im 248/248]
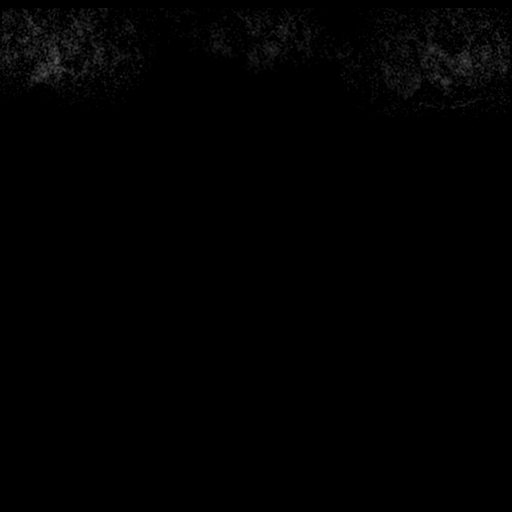

[Series 503: ph3/ vibrant mph · axial · 1.8mm · 0.62mm/px · 1 of 248 slices shown]
[im 1/248]
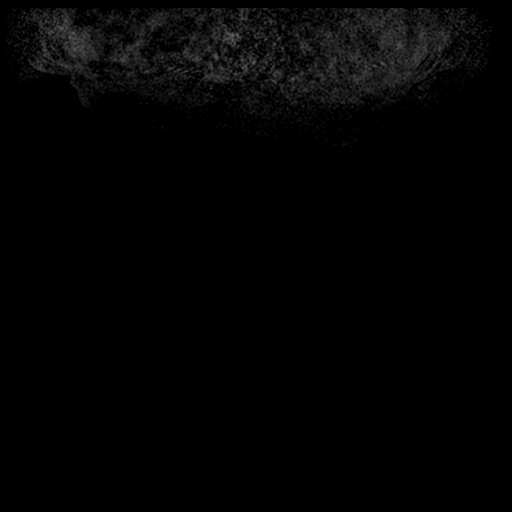

[20 of 48 positions shown; findings below may reference images not displayed]

FINDINGS: Breast composition: c. Heterogeneous fibroglandular tissue.

Background parenchymal enhancement: Minimal.

Right breast: No mass or abnormal enhancement.

Left breast: No mass or abnormal enhancement.

Lymph nodes: No abnormal appearing lymph nodes.

Ancillary findings:  None.
IMPRESSION: No MRI evidence of malignancy in either breast.

RECOMMENDATION:
Annual screening mammography in October 2018.

BI-RADS CATEGORY  1: Negative.

## 2020-04-29 ENCOUNTER — Encounter: Payer: Self-pay | Admitting: Obstetrics

## 2020-04-29 ENCOUNTER — Encounter: Payer: Self-pay | Admitting: Gynecologic Oncology
